# Patient Record
Sex: Female | Born: 1965
Health system: Southern US, Community
[De-identification: ages and names within clinical notes are randomized; demographics above are authoritative.]

## PROBLEM LIST (undated history)

## (undated) DIAGNOSIS — T8859XA Other complications of anesthesia, initial encounter: Secondary | ICD-10-CM

## (undated) DIAGNOSIS — G4730 Sleep apnea, unspecified: Secondary | ICD-10-CM

## (undated) DIAGNOSIS — K219 Gastro-esophageal reflux disease without esophagitis: Secondary | ICD-10-CM

## (undated) DIAGNOSIS — C801 Malignant (primary) neoplasm, unspecified: Secondary | ICD-10-CM

## (undated) DIAGNOSIS — E669 Obesity, unspecified: Secondary | ICD-10-CM

## (undated) DIAGNOSIS — E785 Hyperlipidemia, unspecified: Secondary | ICD-10-CM

## (undated) DIAGNOSIS — I10 Essential (primary) hypertension: Secondary | ICD-10-CM

## (undated) DIAGNOSIS — F419 Anxiety disorder, unspecified: Secondary | ICD-10-CM

## (undated) DIAGNOSIS — Z87442 Personal history of urinary calculi: Secondary | ICD-10-CM

## (undated) DIAGNOSIS — R519 Headache, unspecified: Secondary | ICD-10-CM

## (undated) DIAGNOSIS — G4700 Insomnia, unspecified: Secondary | ICD-10-CM

## (undated) DIAGNOSIS — E559 Vitamin D deficiency, unspecified: Secondary | ICD-10-CM

## (undated) DIAGNOSIS — N200 Calculus of kidney: Secondary | ICD-10-CM

## (undated) DIAGNOSIS — N361 Urethral diverticulum: Secondary | ICD-10-CM

## (undated) DIAGNOSIS — G5600 Carpal tunnel syndrome, unspecified upper limb: Secondary | ICD-10-CM

## (undated) DIAGNOSIS — M1990 Unspecified osteoarthritis, unspecified site: Secondary | ICD-10-CM

## (undated) DIAGNOSIS — M549 Dorsalgia, unspecified: Secondary | ICD-10-CM

## (undated) DIAGNOSIS — Z8489 Family history of other specified conditions: Secondary | ICD-10-CM

## (undated) DIAGNOSIS — L089 Local infection of the skin and subcutaneous tissue, unspecified: Secondary | ICD-10-CM

## (undated) DIAGNOSIS — T4145XA Adverse effect of unspecified anesthetic, initial encounter: Secondary | ICD-10-CM

## (undated) HISTORY — DX: Insomnia, unspecified: G47.00

## (undated) HISTORY — DX: Vitamin D deficiency, unspecified: E55.9

## (undated) HISTORY — PX: CERVICAL SPINE SURGERY: SHX589

## (undated) HISTORY — DX: Hyperlipidemia, unspecified: E78.5

## (undated) HISTORY — DX: Sleep apnea, unspecified: G47.30

## (undated) HISTORY — DX: Malignant (primary) neoplasm, unspecified: C80.1

## (undated) HISTORY — DX: Essential (primary) hypertension: I10

## (undated) HISTORY — DX: Carpal tunnel syndrome, unspecified upper limb: G56.00

## (undated) HISTORY — DX: Gastro-esophageal reflux disease without esophagitis: K21.9

---
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
## 1992-06-24 HISTORY — PX: VAGINAL HYSTERECTOMY: SUR661

## 2007-08-29 ENCOUNTER — Emergency Department: Payer: Self-pay | Admitting: Emergency Medicine

## 2007-09-18 DIAGNOSIS — Z720 Tobacco use: Secondary | ICD-10-CM | POA: Insufficient documentation

## 2007-09-18 DIAGNOSIS — J309 Allergic rhinitis, unspecified: Secondary | ICD-10-CM | POA: Insufficient documentation

## 2007-09-18 DIAGNOSIS — G5600 Carpal tunnel syndrome, unspecified upper limb: Secondary | ICD-10-CM | POA: Insufficient documentation

## 2007-09-18 DIAGNOSIS — G43909 Migraine, unspecified, not intractable, without status migrainosus: Secondary | ICD-10-CM | POA: Insufficient documentation

## 2007-09-18 DIAGNOSIS — G4700 Insomnia, unspecified: Secondary | ICD-10-CM | POA: Insufficient documentation

## 2007-09-18 DIAGNOSIS — E7800 Pure hypercholesterolemia, unspecified: Secondary | ICD-10-CM | POA: Insufficient documentation

## 2008-04-12 ENCOUNTER — Ambulatory Visit: Payer: Self-pay | Admitting: Family Medicine

## 2008-06-24 HISTORY — PX: OTHER SURGICAL HISTORY: SHX169

## 2008-10-18 ENCOUNTER — Ambulatory Visit: Payer: Self-pay | Admitting: Family Medicine

## 2009-03-20 ENCOUNTER — Ambulatory Visit: Payer: Self-pay | Admitting: Family Medicine

## 2009-07-03 DIAGNOSIS — S2239XA Fracture of one rib, unspecified side, initial encounter for closed fracture: Secondary | ICD-10-CM | POA: Insufficient documentation

## 2009-07-12 DIAGNOSIS — M79609 Pain in unspecified limb: Secondary | ICD-10-CM | POA: Insufficient documentation

## 2009-08-15 DIAGNOSIS — M654 Radial styloid tenosynovitis [de Quervain]: Secondary | ICD-10-CM | POA: Insufficient documentation

## 2009-08-18 DIAGNOSIS — R030 Elevated blood-pressure reading, without diagnosis of hypertension: Secondary | ICD-10-CM | POA: Insufficient documentation

## 2010-04-16 ENCOUNTER — Ambulatory Visit: Payer: Self-pay | Admitting: Urology

## 2011-01-17 ENCOUNTER — Emergency Department: Payer: Self-pay | Admitting: Emergency Medicine

## 2012-12-01 ENCOUNTER — Ambulatory Visit: Payer: Self-pay | Admitting: Family Medicine

## 2012-12-14 ENCOUNTER — Ambulatory Visit: Payer: Self-pay | Admitting: Family Medicine

## 2013-02-02 ENCOUNTER — Ambulatory Visit: Payer: Self-pay | Admitting: Family Medicine

## 2013-02-24 ENCOUNTER — Ambulatory Visit: Payer: Self-pay | Admitting: Family Medicine

## 2013-12-10 LAB — HM PAP SMEAR: HM Pap smear: NEGATIVE

## 2013-12-17 ENCOUNTER — Ambulatory Visit: Payer: Self-pay | Admitting: Family Medicine

## 2014-01-16 LAB — HM MAMMOGRAPHY

## 2014-03-10 ENCOUNTER — Ambulatory Visit: Payer: Self-pay | Admitting: Family Medicine

## 2014-03-11 LAB — HEPATIC FUNCTION PANEL
ALT: 20 U/L (ref 7–35)
AST: 13 U/L (ref 13–35)

## 2014-03-11 LAB — CBC AND DIFFERENTIAL
HCT: 47 % — AB (ref 36–46)
Hemoglobin: 16.1 g/dL — AB (ref 12.0–16.0)
Platelets: 270 10*3/uL (ref 150–399)
WBC: 5.8 10^3/mL

## 2014-03-11 LAB — BASIC METABOLIC PANEL
BUN: 18 mg/dL (ref 4–21)
Creatinine: 1 mg/dL (ref 0.5–1.1)
Glucose: 100 mg/dL
Potassium: 4.5 mmol/L (ref 3.4–5.3)
Sodium: 141 mmol/L (ref 137–147)

## 2014-03-29 ENCOUNTER — Ambulatory Visit: Payer: Self-pay

## 2014-04-07 ENCOUNTER — Emergency Department: Payer: Self-pay | Admitting: Emergency Medicine

## 2014-06-13 ENCOUNTER — Ambulatory Visit: Payer: Self-pay | Admitting: Gastroenterology

## 2014-06-13 LAB — HM COLONOSCOPY: HM Colonoscopy: NORMAL

## 2014-06-24 HISTORY — PX: BREAST BIOPSY: SHX20

## 2014-08-01 ENCOUNTER — Ambulatory Visit: Payer: Self-pay | Admitting: Family Medicine

## 2014-12-19 ENCOUNTER — Encounter: Payer: Self-pay | Admitting: Family Medicine

## 2014-12-19 ENCOUNTER — Ambulatory Visit (INDEPENDENT_AMBULATORY_CARE_PROVIDER_SITE_OTHER): Payer: BLUE CROSS/BLUE SHIELD | Admitting: Family Medicine

## 2014-12-19 VITALS — BP 112/72 | HR 72 | Temp 97.8°F | Resp 16 | Ht 65.5 in | Wt 189.2 lb

## 2014-12-19 DIAGNOSIS — M5416 Radiculopathy, lumbar region: Secondary | ICD-10-CM

## 2014-12-19 DIAGNOSIS — M5410 Radiculopathy, site unspecified: Secondary | ICD-10-CM | POA: Diagnosis not present

## 2014-12-19 MED ORDER — HYDROCODONE-ACETAMINOPHEN 5-325 MG PO TABS: ORAL_TABLET | ORAL | Status: DC

## 2014-12-19 MED ORDER — PREDNISONE 20 MG PO TABS: ORAL_TABLET | ORAL | Status: DC

## 2014-12-19 MED ORDER — CYCLOBENZAPRINE HCL 5 MG PO TABS: 5.0000 mg | ORAL_TABLET | Freq: Every day | ORAL | Status: DC

## 2014-12-19 NOTE — Patient Instructions (Signed)
Stop ibuprofen while on prednisone.
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
## 2014-12-20 ENCOUNTER — Ambulatory Visit
Admission: RE | Admit: 2014-12-20 | Discharge: 2014-12-20 | Disposition: A | Discharge status: Home / Self Care | Payer: BLUE CROSS/BLUE SHIELD | Source: Ambulatory Visit | Attending: Family Medicine | Admitting: Family Medicine

## 2014-12-20 ENCOUNTER — Telehealth: Payer: Self-pay

## 2014-12-20 ENCOUNTER — Telehealth: Payer: Self-pay | Admitting: Family Medicine

## 2014-12-20 DIAGNOSIS — M5416 Radiculopathy, lumbar region: Secondary | ICD-10-CM

## 2014-12-20 DIAGNOSIS — M545 Low back pain: Secondary | ICD-10-CM | POA: Diagnosis present

## 2014-12-20 DIAGNOSIS — M5410 Radiculopathy, site unspecified: Secondary | ICD-10-CM | POA: Insufficient documentation

## 2014-12-20 NOTE — Telephone Encounter (Signed)
-----   Message from [name], [place] sent at 12/20/2014 10:26 AM EDT ----- Corliss Blacker of mild arthritic changes. If you do not improve with current treatment we will send you to orthopedics.

## 2014-12-20 NOTE — Telephone Encounter (Signed)
Patient has been advised,

## 2014-12-20 NOTE — Telephone Encounter (Signed)
Stacy with [facility] called to verify the dosage for Rx HYDROcodone-acetaminophen (NORCO/VICODIN) 5-325 MG.  RW#[phone]/YP

## 2014-12-30 DIAGNOSIS — N63 Unspecified lump in unspecified breast: Secondary | ICD-10-CM | POA: Insufficient documentation

## 2014-12-30 DIAGNOSIS — J159 Unspecified bacterial pneumonia: Secondary | ICD-10-CM | POA: Insufficient documentation

## 2014-12-30 DIAGNOSIS — Z8541 Personal history of malignant neoplasm of cervix uteri: Secondary | ICD-10-CM | POA: Insufficient documentation

## 2014-12-30 DIAGNOSIS — H8110 Benign paroxysmal vertigo, unspecified ear: Secondary | ICD-10-CM | POA: Insufficient documentation

## 2014-12-30 DIAGNOSIS — N200 Calculus of kidney: Secondary | ICD-10-CM | POA: Insufficient documentation

## 2014-12-30 DIAGNOSIS — Z713 Dietary counseling and surveillance: Secondary | ICD-10-CM | POA: Insufficient documentation

## 2014-12-30 DIAGNOSIS — R109 Unspecified abdominal pain: Secondary | ICD-10-CM | POA: Insufficient documentation

## 2014-12-30 DIAGNOSIS — K219 Gastro-esophageal reflux disease without esophagitis: Secondary | ICD-10-CM | POA: Insufficient documentation

## 2014-12-30 DIAGNOSIS — G4733 Obstructive sleep apnea (adult) (pediatric): Secondary | ICD-10-CM | POA: Insufficient documentation

## 2014-12-30 DIAGNOSIS — E559 Vitamin D deficiency, unspecified: Secondary | ICD-10-CM | POA: Insufficient documentation

## 2014-12-30 DIAGNOSIS — M549 Dorsalgia, unspecified: Secondary | ICD-10-CM | POA: Insufficient documentation

## 2014-12-30 DIAGNOSIS — R0683 Snoring: Secondary | ICD-10-CM | POA: Insufficient documentation

## 2014-12-30 DIAGNOSIS — Z1389 Encounter for screening for other disorder: Secondary | ICD-10-CM | POA: Insufficient documentation

## 2015-01-02 ENCOUNTER — Encounter: Payer: BLUE CROSS/BLUE SHIELD | Admitting: Family Medicine

## 2015-04-20 ENCOUNTER — Ambulatory Visit (INDEPENDENT_AMBULATORY_CARE_PROVIDER_SITE_OTHER): Payer: BLUE CROSS/BLUE SHIELD

## 2015-04-20 DIAGNOSIS — Z23 Encounter for immunization: Secondary | ICD-10-CM

## 2015-05-12 ENCOUNTER — Ambulatory Visit (INDEPENDENT_AMBULATORY_CARE_PROVIDER_SITE_OTHER): Payer: BLUE CROSS/BLUE SHIELD | Admitting: Physician Assistant

## 2015-05-12 ENCOUNTER — Encounter: Payer: Self-pay | Admitting: Physician Assistant

## 2015-05-12 VITALS — BP 120/70 | HR 81 | Temp 97.8°F | Resp 16 | Ht 65.0 in | Wt 190.0 lb

## 2015-05-12 DIAGNOSIS — Z23 Encounter for immunization: Secondary | ICD-10-CM

## 2015-05-12 DIAGNOSIS — E7800 Pure hypercholesterolemia, unspecified: Secondary | ICD-10-CM | POA: Diagnosis not present

## 2015-05-12 DIAGNOSIS — N63 Unspecified lump in unspecified breast: Secondary | ICD-10-CM

## 2015-05-12 DIAGNOSIS — K219 Gastro-esophageal reflux disease without esophagitis: Secondary | ICD-10-CM | POA: Diagnosis not present

## 2015-05-12 DIAGNOSIS — Z1239 Encounter for other screening for malignant neoplasm of breast: Secondary | ICD-10-CM

## 2015-05-12 DIAGNOSIS — R030 Elevated blood-pressure reading, without diagnosis of hypertension: Secondary | ICD-10-CM

## 2015-05-12 DIAGNOSIS — M47816 Spondylosis without myelopathy or radiculopathy, lumbar region: Secondary | ICD-10-CM

## 2015-05-12 DIAGNOSIS — Z0000 Encounter for general adult medical examination without abnormal findings: Secondary | ICD-10-CM | POA: Diagnosis not present

## 2015-05-12 MED ORDER — PANTOPRAZOLE SODIUM 40 MG PO TBEC: 40.0000 mg | DELAYED_RELEASE_TABLET | Freq: Every day | ORAL | Status: DC

## 2015-05-12 NOTE — Patient Instructions (Signed)
Health Maintenance, Female Adopting a healthy lifestyle and getting preventive care can go a long way to promote health and wellness. Talk with your health care provider about what schedule of regular examinations is right for you. This is a good chance for you to check in with your provider about disease prevention and staying healthy. In between checkups, there are plenty of things you can do on your own. Experts have done a lot of research about which lifestyle changes and preventive measures are most likely to keep you healthy. Ask your health care provider for more information. WEIGHT AND DIET  Eat a healthy diet 1. Be sure to include plenty of vegetables, fruits, low-fat dairy products, and lean protein. 2. Do not eat a lot of foods high in solid fats, added sugars, or salt. 3. Get regular exercise. This is one of the most important things you can do for your health. 1. Most adults should exercise for at least 150 minutes each week. The exercise should increase your heart rate and make you sweat (moderate-intensity exercise). 2. Most adults should also do strengthening exercises at least twice a week. This is in addition to the moderate-intensity exercise.  Maintain a healthy weight 1. Body mass index (BMI) is a measurement that can be used to identify possible weight problems. It estimates body fat based on height and weight. Your health care provider can help determine your BMI and help you achieve or maintain a healthy weight. 2. For females 87 years of age and older:  1. A BMI below 18.5 is considered underweight. 2. A BMI of 18.5 to 24.9 is normal. 3. A BMI of 25 to 29.9 is considered overweight. 4. A BMI of 30 and above is considered obese.  Watch levels of cholesterol and blood lipids 1. You should start having your blood tested for lipids and cholesterol at 51 years of age, then have this test every 5 years. 2. You may need to have your cholesterol levels checked more often  if: 1. Your lipid or cholesterol levels are high. 2. You are older than 51 years of age. 3. You are at high risk for heart disease.  CANCER SCREENING   Lung Cancer 1. Lung cancer screening is recommended for adults 87-65 years old who are at high risk for lung cancer because of a history of smoking. 2. A yearly low-dose CT scan of the lungs is recommended for people who: 1. Currently smoke. 2. Have quit within the past 15 years. 3. Have at least a 30-pack-year history of smoking. A pack year is smoking an average of one pack of cigarettes a day for 1 year. 3. Yearly screening should continue until it has been 15 years since you quit. 4. Yearly screening should stop if you develop a health problem that would prevent you from having lung cancer treatment.  Breast Cancer  Practice breast self-awareness. This means understanding how your breasts normally appear and feel.  It also means doing regular breast self-exams. Let your health care provider know about any changes, no matter how small.  If you are in your 20s or 30s, you should have a clinical breast exam (CBE) by a health care provider every 1-3 years as part of a regular health exam.  If you are 72 or older, have a CBE every year. Also consider having a breast X-ray (mammogram) every year.  If you have a family history of breast cancer, talk to your health care provider about genetic screening.  If you  are at high risk for breast cancer, talk to your health care provider about having an MRI and a mammogram every year.  Breast cancer gene (BRCA) assessment is recommended for women who have family members with BRCA-related cancers. BRCA-related cancers include:  Breast.  Ovarian.  Tubal.  Peritoneal cancers.  Results of the assessment will determine the need for genetic counseling and BRCA1 and BRCA2 testing. Cervical Cancer Your health care provider may recommend that you be screened regularly for cancer of the pelvic  organs (ovaries, uterus, and vagina). This screening involves a pelvic examination, including checking for microscopic changes to the surface of your cervix (Pap test). You may be encouraged to have this screening done every 3 years, beginning at age 23.  For women ages 38-65, health care providers may recommend pelvic exams and Pap testing every 3 years, or they may recommend the Pap and pelvic exam, combined with testing for human papilloma virus (HPV), every 5 years. Some types of HPV increase your risk of cervical cancer. Testing for HPV may also be done on women of any age with unclear Pap test results.  Other health care providers may not recommend any screening for nonpregnant women who are considered low risk for pelvic cancer and who do not have symptoms. Ask your health care provider if a screening pelvic exam is right for you.  If you have had past treatment for cervical cancer or a condition that could lead to cancer, you need Pap tests and screening for cancer for at least 20 years after your treatment. If Pap tests have been discontinued, your risk factors (such as having a new sexual partner) need to be reassessed to determine if screening should resume. Some women have medical problems that increase the chance of getting cervical cancer. In these cases, your health care provider may recommend more frequent screening and Pap tests. Colorectal Cancer  This type of cancer can be detected and often prevented.  Routine colorectal cancer screening usually begins at 51 years of age and continues through 51 years of age.  Your health care provider may recommend screening at an earlier age if you have risk factors for colon cancer.  Your health care provider may also recommend using home test kits to check for hidden blood in the stool.  A small camera at the end of a tube can be used to examine your colon directly (sigmoidoscopy or colonoscopy). This is done to check for the earliest forms  of colorectal cancer.  Routine screening usually begins at age 17.  Direct examination of the colon should be repeated every 5-10 years through 51 years of age. However, you may need to be screened more often if early forms of precancerous polyps or small growths are found. Skin Cancer  Check your skin from head to toe regularly.  Tell your health care provider about any new moles or changes in moles, especially if there is a change in a mole's shape or color.  Also tell your health care provider if you have a mole that is larger than the size of a pencil eraser.  Always use sunscreen. Apply sunscreen liberally and repeatedly throughout the day.  Protect yourself by wearing long sleeves, pants, a wide-brimmed hat, and sunglasses whenever you are outside. HEART DISEASE, DIABETES, AND HIGH BLOOD PRESSURE   High blood pressure causes heart disease and increases the risk of stroke. High blood pressure is more likely to develop in:  People who have blood pressure in the high end  of the normal range (130-139/85-89 mm Hg).  People who are overweight or obese.  People who are African American.  If you are 85-42 years of age, have your blood pressure checked every 3-5 years. If you are 20 years of age or older, have your blood pressure checked every year. You should have your blood pressure measured twice--once when you are at a hospital or clinic, and once when you are not at a hospital or clinic. Record the average of the two measurements. To check your blood pressure when you are not at a hospital or clinic, you can use:  An automated blood pressure machine at a pharmacy.  A home blood pressure monitor.  If you are between 19 years and 53 years old, ask your health care provider if you should take aspirin to prevent strokes.  Have regular diabetes screenings. This involves taking a blood sample to check your fasting blood sugar level.  If you are at a normal weight and have a low risk  for diabetes, have this test once every three years after 51 years of age.  If you are overweight and have a high risk for diabetes, consider being tested at a younger age or more often. PREVENTING INFECTION  Hepatitis B  If you have a higher risk for hepatitis B, you should be screened for this virus. You are considered at high risk for hepatitis B if:  You were born in a country where hepatitis B is common. Ask your health care provider which countries are considered high risk.  Your parents were born in a high-risk country, and you have not been immunized against hepatitis B (hepatitis B vaccine).  You have HIV or AIDS.  You use needles to inject street drugs.  You live with someone who has hepatitis B.  You have had sex with someone who has hepatitis B.  You get hemodialysis treatment.  You take certain medicines for conditions, including cancer, organ transplantation, and autoimmune conditions. Hepatitis C  Blood testing is recommended for:  Everyone born from [birth year] through 1967.  Anyone with known risk factors for hepatitis C. Sexually transmitted infections (STIs)  You should be screened for sexually transmitted infections (STIs) including gonorrhea and chlamydia if:  You are sexually active and are younger than 51 years of age.  You are older than 51 years of age and your health care provider tells you that you are at risk for this type of infection.  Your sexual activity has changed since you were last screened and you are at an increased risk for chlamydia or gonorrhea. Ask your health care provider if you are at risk.  If you do not have HIV, but are at risk, it may be recommended that you take a prescription medicine daily to prevent HIV infection. This is called pre-exposure prophylaxis (PrEP). You are considered at risk if:  You are sexually active and do not regularly use condoms or know the HIV status of your partner(s).  You take drugs by injection.  You  are sexually active with a partner who has HIV. Talk with your health care provider about whether you are at high risk of being infected with HIV. If you choose to begin PrEP, you should first be tested for HIV. You should then be tested every 3 months for as long as you are taking PrEP.  PREGNANCY   If you are premenopausal and you may become pregnant, ask your health care provider about preconception counseling.  If you may  become pregnant, take 400 to 800 micrograms (mcg) of folic acid every day.  If you want to prevent pregnancy, talk to your health care provider about birth control (contraception). OSTEOPOROSIS AND MENOPAUSE   Osteoporosis is a disease in which the bones lose minerals and strength with aging. This can result in serious bone fractures. Your risk for osteoporosis can be identified using a bone density scan.  If you are 29 years of age or older, or if you are at risk for osteoporosis and fractures, ask your health care provider if you should be screened.  Ask your health care provider whether you should take a calcium or vitamin D supplement to lower your risk for osteoporosis.  Menopause may have certain physical symptoms and risks.  Hormone replacement therapy may reduce some of these symptoms and risks. Talk to your health care provider about whether hormone replacement therapy is right for you.  HOME CARE INSTRUCTIONS   Schedule regular health, dental, and eye exams.  Stay current with your immunizations.   Do not use any tobacco products including cigarettes, chewing tobacco, or electronic cigarettes.  If you are pregnant, do not drink alcohol.  If you are breastfeeding, limit how much and how often you drink alcohol.  Limit alcohol intake to no more than 1 drink per day for nonpregnant women. One drink equals 12 ounces of beer, 5 ounces of wine, or 1 ounces of hard liquor.  Do not use street drugs.  Do not share needles.  Ask your health care provider  for help if you need support or information about quitting drugs.  Tell your health care provider if you often feel depressed.  Tell your health care provider if you have ever been abused or do not feel safe at home.   This information is not intended to replace advice given to you by your health care provider. Make sure you discuss any questions you have with your health care provider.   Document Released: 12/24/2010 Document Revised: 07/01/2014 Document Reviewed: 05/12/2013 Elsevier Interactive Patient Education 2016 [place] [facility]) Exercise Recommendation  Being physically active is important to prevent heart disease and stroke, the nation's No. 1and No. 5killers. To improve overall cardiovascular health, we suggest at least 150 minutes per week of moderate exercise or 75 minutes per week of vigorous exercise (or a combination of moderate and vigorous activity). Thirty minutes a day, five times a week is an easy goal to remember. You will also experience benefits even if you divide your time into two or three segments of 10 to 15 minutes per day.  For people who would benefit from lowering their blood pressure or cholesterol, we recommend 40 minutes of aerobic exercise of moderate to vigorous intensity three to four times a week to lower the risk for heart attack and stroke.  Physical activity is anything that makes you move your body and burn calories.  This includes things like climbing stairs or playing sports. Aerobic exercises benefit your heart, and include walking, jogging, swimming or biking. Strength and stretching exercises are best for overall stamina and flexibility.  The simplest, positive change you can make to effectively improve your heart health is to start walking. It's enjoyable, free, easy, social and great exercise. A walking program is flexible and boasts high success rates because people can stick with it. It's easy for walking to become  a regular and satisfying part of life.   For Overall Cardiovascular Health:  At least 30  minutes of moderate-intensity aerobic activity at least 5 days per week for a total of 150  OR   At least 25 minutes of vigorous aerobic activity at least 3 days per week for a total of 75 minutes; or a combination of moderate- and vigorous-intensity aerobic activity  AND   Moderate- to high-intensity muscle-strengthening activity at least 2 days per week for additional health benefits.  For Lowering Blood Pressure and Cholesterol  An average 40 minutes of moderate- to vigorous-intensity aerobic activity 3 or 4 times per week  What if I can't make it to the time goal? Something is always better than nothing! And everyone has to start somewhere. Even if you've been sedentary for years, today is the day you can begin to make healthy changes in your life. If you don't think you'll make it for 30 or 40 minutes, set a reachable goal for today. You can work up toward your overall goal by increasing your time as you get stronger. Don't let all-or-nothing thinking rob you of doing what you can every day.  Source:http://www.heart.org

## 2015-05-12 NOTE — Progress Notes (Signed)
Patient: Dawn Summers, Female    DOB: 07-Mar-1966, 49 y.o.   MRN: [id number] Visit Date: 05/12/2015  Today's Provider: Mar Daring, PA-C   Chief Complaint  Patient presents with  . Annual Exam   Subjective:    Annual physical exam Dawn Summers is a 49 y.o. female who presents today for health maintenance and complete physical. She feels well. She reports not exercising. She reports she is sleeping poorly. Per patient has tried many medicines to help her go to sleep.  Had pneumonia twice last year.   PCP:12/10/13 Pap: 2015;NL Mammogram: 12/17/13 Negative repeat in 1 year. Colonoscopy: 06/13/14; Normal- repeat in 10 years.     Review of Systems  Constitutional: Positive for diaphoresis and fatigue.  HENT: Positive for congestion, sinus pressure and sneezing.   Eyes: Positive for pain.  Gastrointestinal: Positive for constipation and abdominal distention.  Genitourinary: Positive for urgency.  Musculoskeletal: Positive for back pain, joint swelling, arthralgias and neck pain.  Neurological: Positive for numbness.  Psychiatric/Behavioral: Positive for sleep disturbance.  All other systems reviewed and are negative.   Social History She  reports that she quit smoking about 10 years ago. She has never used smokeless tobacco. She reports that she drinks alcohol. She reports that she does not use illicit drugs. Social History   Social History  . Marital Status: Single    Spouse Name: N/A  . Number of Children: N/A  . Years of Education: N/A   Social History Main Topics  . Smoking status: Former Smoker    Quit date: 11/22/2004  . Smokeless tobacco: Never Used  . Alcohol Use: 0.0 oz/week    0 Standard drinks or equivalent per week  . Drug Use: No  . Sexual Activity: Not Asked   Other Topics Concern  . None   Social History Narrative    Patient Active Problem List   Diagnosis Date Noted  . Back ache 12/30/2014  . Benign paroxysmal positional  nystagmus 12/30/2014  . Breast lump 12/30/2014  . Malignant neoplasm of cervix uteri ([place]) 12/30/2014  . Screening for gout 12/30/2014  . Esophageal reflux 12/30/2014  . Calculus of kidney 12/30/2014  . Bacterial pneumonia 12/30/2014  . Right flank pain 12/30/2014  . Obstructive apnea 12/30/2014  . Snores 12/30/2014  . Avitaminosis D 12/30/2014  . Blood pressure elevated without history of HTN 08/18/2009  . Radial styloid tenosynovitis 08/15/2009  . Extremity pain 07/12/2009  . Closed fracture of one rib 07/03/2009  . Allergic rhinitis 09/18/2007  . Carpal tunnel syndrome 09/18/2007  . Cannot sleep 09/18/2007  . Headache, migraine 09/18/2007  . Hypercholesterolemia without hypertriglyceridemia 09/18/2007  . Tobacco use 09/18/2007    Past Surgical History  Procedure Laterality Date  . Vaginal hysterectomy  1994    cervical cancer, ovaries intact  . Cervical spine surgery      C-3 and C-4 fusion  . Bladder "pack"  2010    Family History  Family Status  Relation Status Death Age  . Mother Deceased   . Father Alive    Her family history includes COPD in her father; Cancer in her father; Colon polyps in her father; Congestive Heart Failure in her mother; Coronary artery disease in her mother; Diabetes in her mother; Hyperlipidemia in her father; Hypertension in her father; Lung disease in her mother; Stroke in her father.    Allergies  Allergen Reactions  . Penicillins Hives  . Sulfa Antibiotics Nausea And Vomiting  .  Trazodone And Nefazodone Nausea Only and Other (See Comments)    dizziness    Previous Medications   OMEPRAZOLE (PRILOSEC) 20 MG CAPSULE    Take 20 mg by mouth daily.    Patient Care Team: Margarita Rana, MD as PCP - General (Family Medicine)     Objective:   Vitals: BP 120/70 mmHg  Pulse 81  Temp(Src) 97.8 F (36.6 C) (Oral)  Resp 16  Ht 5\' 5"  (1.651 m)  Wt 190 lb (86.183 kg)  BMI 31.62 kg/m2   Physical Exam  Constitutional: She is oriented  to person, place, and time. She appears well-developed and well-nourished. No distress.  HENT:  Head: Normocephalic and atraumatic.  Right Ear: External ear normal.  Left Ear: External ear normal.  Nose: Nose normal.  Mouth/Throat: Oropharynx is clear and moist. No oropharyngeal exudate.  Eyes: Conjunctivae and EOM are normal. Pupils are equal, round, and reactive to light. Right eye exhibits no discharge. Left eye exhibits no discharge. No scleral icterus.  Neck: Normal range of motion. Neck supple. No JVD present. No tracheal deviation present. No thyromegaly present.  Cardiovascular: Normal rate, regular rhythm, normal heart sounds and intact distal pulses.  Exam reveals no gallop and no friction rub.   No murmur heard. Pulmonary/Chest: Effort normal and breath sounds normal. No respiratory distress. She has no wheezes. She has no rales. She exhibits no tenderness. Right breast exhibits no inverted nipple, no mass, no nipple discharge, no skin change and no tenderness. Left breast exhibits mass and tenderness. Left breast exhibits no inverted nipple, no nipple discharge and no skin change. Breasts are symmetrical.    Abdominal: Soft. Bowel sounds are normal. She exhibits no distension and no mass. There is no tenderness. There is no rebound and no guarding.  Musculoskeletal: Normal range of motion. She exhibits no edema or tenderness.  Lymphadenopathy:    She has no cervical adenopathy.  Neurological: She is alert and oriented to person, place, and time.  Skin: Skin is warm and dry. No rash noted. She is not diaphoretic.  Psychiatric: She has a normal mood and affect. Her behavior is normal. Judgment and thought content normal.  Vitals reviewed.    Depression Screen No flowsheet data found.    Assessment & Plan:     Routine Health Maintenance and Physical Exam  1. Annual physical exam Will check labs as below.  Will follow up pending lab results. If labs are normal will follow  up in one year for repeat physical exam. - CBC with Differential - TSH  2. Hypercholesterolemia without hypertriglyceridemia H/O this.  Will check labs and follow up pending labs. - Lipid panel  3. Blood pressure elevated without history of HTN H/O this.  BP currently stable on diet and exercise.  Will check labs and f/u pending lab results.  - Comprehensive metabolic panel  4. Breast cancer screening No family history of breast cancer.  Lump was found today in left breast.  See breast lump for description.  Will order mammogram with diagnostic and [place] for further evaluation if needed. Information for [facility] breast clinic was given to patient for her to call and schedule appointment. - MM DIGITAL SCREENING BILATERAL; Future  5. Lumbar spondylosis, unspecified spinal osteoarthritis Long standing history of low back pain.  Patient is requesting referral to orthopedics for further evaluation and treatment options.  Will try to get appointment with Dr. Sharlet Salina. - Ambulatory referral to Orthopedic Surgery  6. Gastroesophageal reflux disease without esophagitis Starting to  have sensation of food getting stuck more frequently now.  She has even had episodes of regurgitating undigested food.  Protonix was refilled as below.  Will get modified barium swallow to evaluate for esophageal stricture/dysmotility vs hiatal hernia.  Will follow up pending results. - pantoprazole (PROTONIX) 40 MG tablet; Take 1 tablet (40 mg total) by mouth daily.  Dispense: 30 tablet; Refill: 3 - SLP modified barium swallow; Future  7. Breast lump Small, moveable and slightly tender breast lump noted in left breast measuring approx. 1 cm x 0.5 cm.  It was located at 1 o'clock approx 2 cm from nipple line.  Screening, diagnostic mammograms and diagnostic US are ordered.  She was given information to call [place] breast clinic to schedule these tests.  Will follow pending results. - MM Digital Diagnostic Bilat;  Future  8. Need for pneumococcal vaccination Pneumococcal vaccine was given today without complication.  Personal history of pneumonia twice last year. - Pneumococcal Polysaccharide 23   Exercise Activities and Dietary recommendations Goals    None      Immunization History  Administered Date(s) Administered  . Influenza,inj,Quad PF,36+ Mos 04/20/2015  . Tdap 11/24/2012    Health Maintenance  Topic Date Due  . Hepatitis C Screening  [birth year]  . HIV Screening  04/25/1979  . MAMMOGRAM  01/17/2016  . INFLUENZA VACCINE  01/23/2016  . PAP SMEAR  12/10/2016  . TETANUS/TDAP  11/25/2022  . COLONOSCOPY  06/13/2024      Discussed health benefits of physical activity, and encouraged her to engage in regular exercise appropriate for her age and condition.    --------------------------------------------------------------------

## 2015-05-16 ENCOUNTER — Other Ambulatory Visit: Payer: Self-pay | Admitting: Family Medicine

## 2015-05-17 ENCOUNTER — Other Ambulatory Visit: Payer: Self-pay | Admitting: Physician Assistant

## 2015-05-17 DIAGNOSIS — N6321 Unspecified lump in the left breast, upper outer quadrant: Secondary | ICD-10-CM

## 2015-05-18 LAB — CBC WITH DIFFERENTIAL/PLATELET
Basophils Absolute: 0 10*3/uL (ref 0.0–0.2)
Basos: 1 %
EOS (ABSOLUTE): 0.2 10*3/uL (ref 0.0–0.4)
Eos: 3 %
Hematocrit: 47.8 % — ABNORMAL HIGH (ref 34.0–46.6)
Hemoglobin: 16.2 g/dL — ABNORMAL HIGH (ref 11.1–15.9)
Immature Grans (Abs): 0 10*3/uL (ref 0.0–0.1)
Immature Granulocytes: 1 %
Lymphocytes Absolute: 2 10*3/uL (ref 0.7–3.1)
Lymphs: 37 %
MCH: 29.2 pg (ref 26.6–33.0)
MCHC: 33.9 g/dL (ref 31.5–35.7)
MCV: 86 fL (ref 79–97)
Monocytes Absolute: 0.5 10*3/uL (ref 0.1–0.9)
Monocytes: 9 %
Neutrophils Absolute: 2.7 10*3/uL (ref 1.4–7.0)
Neutrophils: 49 %
Platelets: 285 10*3/uL (ref 150–379)
RBC: 5.54 x10E6/uL — ABNORMAL HIGH (ref 3.77–5.28)
RDW: 13.4 % (ref 12.3–15.4)
WBC: 5.4 10*3/uL (ref 3.4–10.8)

## 2015-05-18 LAB — COMPREHENSIVE METABOLIC PANEL
ALT: 27 IU/L (ref 0–32)
AST: 19 IU/L (ref 0–40)
Albumin/Globulin Ratio: 1.7 (ref 1.1–2.5)
Albumin: 4.4 g/dL (ref 3.5–5.5)
Alkaline Phosphatase: 87 IU/L (ref 39–117)
BUN/Creatinine Ratio: 21 (ref 9–23)
BUN: 18 mg/dL (ref 6–24)
Bilirubin Total: 0.5 mg/dL (ref 0.0–1.2)
CO2: 27 mmol/L (ref 18–29)
Calcium: 9.8 mg/dL (ref 8.7–10.2)
Chloride: 100 mmol/L (ref 97–106)
Creatinine, Ser: 0.84 mg/dL (ref 0.57–1.00)
GFR calc Af Amer: 93 mL/min/{1.73_m2} (ref >59)
GFR calc non Af Amer: 81 mL/min/{1.73_m2} (ref >59)
Globulin, Total: 2.6 g/dL (ref 1.5–4.5)
Glucose: 88 mg/dL (ref 65–99)
Potassium: 4.7 mmol/L (ref 3.5–5.2)
Sodium: 143 mmol/L (ref 136–144)
Total Protein: 7 g/dL (ref 6.0–8.5)

## 2015-05-18 LAB — LIPID PANEL
Chol/HDL Ratio: 2.9 ratio units (ref 0.0–4.4)
Cholesterol, Total: 224 mg/dL — ABNORMAL HIGH (ref 100–199)
HDL: 77 mg/dL (ref >39)
LDL Calculated: 133 mg/dL — ABNORMAL HIGH (ref 0–99)
Triglycerides: 72 mg/dL (ref 0–149)
VLDL Cholesterol Cal: 14 mg/dL (ref 5–40)

## 2015-05-18 LAB — TSH: TSH: 1.8 u[IU]/mL (ref 0.450–4.500)

## 2015-05-19 ENCOUNTER — Telehealth: Payer: Self-pay

## 2015-05-19 NOTE — Telephone Encounter (Signed)
LMTCB  Thanks.  -Nolawi Kanady 

## 2015-05-19 NOTE — Telephone Encounter (Signed)
-----   Message from Mar Daring, [place] sent at 05/19/2015  8:06 AM EST ----- All labs are stable and WNL with exception of cholesterol is slightly elevated at 224 and LDL is 133, however, your HDL (good) cholesterol is elevated at 77 which offers cardioprotection against the higher LDL.  There is no need for cholesterol lowering medication at this time.  Just be mindful of your diet and try to limit foods with high fat content and high cholesterol.

## 2015-05-22 ENCOUNTER — Other Ambulatory Visit: Payer: Self-pay | Admitting: Physician Assistant

## 2015-05-22 DIAGNOSIS — R1310 Dysphagia, unspecified: Secondary | ICD-10-CM

## 2015-05-22 NOTE — Telephone Encounter (Signed)
Pt returning call to [place].  Please call back

## 2015-05-22 NOTE — Telephone Encounter (Signed)
Patient advised as directed below.  Thanks,  -Ming Mcmannis 

## 2015-05-25 ENCOUNTER — Other Ambulatory Visit: Payer: Self-pay | Admitting: Physician Assistant

## 2015-05-30 ENCOUNTER — Ambulatory Visit
Admission: RE | Admit: 2015-05-30 | Discharge: 2015-05-30 | Disposition: A | Discharge status: Home / Self Care | Payer: BLUE CROSS/BLUE SHIELD | Source: Ambulatory Visit | Attending: Physician Assistant | Admitting: Physician Assistant

## 2015-05-30 ENCOUNTER — Other Ambulatory Visit: Payer: Self-pay | Admitting: Physician Assistant

## 2015-05-30 DIAGNOSIS — R1310 Dysphagia, unspecified: Secondary | ICD-10-CM | POA: Diagnosis present

## 2015-05-30 DIAGNOSIS — K449 Diaphragmatic hernia without obstruction or gangrene: Secondary | ICD-10-CM | POA: Diagnosis not present

## 2015-05-31 ENCOUNTER — Telehealth: Payer: Self-pay

## 2015-05-31 DIAGNOSIS — R1310 Dysphagia, unspecified: Secondary | ICD-10-CM

## 2015-05-31 DIAGNOSIS — K449 Diaphragmatic hernia without obstruction or gangrene: Secondary | ICD-10-CM

## 2015-05-31 DIAGNOSIS — K219 Gastro-esophageal reflux disease without esophagitis: Secondary | ICD-10-CM

## 2015-05-31 NOTE — Telephone Encounter (Signed)
LMTCB  Thanks,  -Joseline 

## 2015-05-31 NOTE — Telephone Encounter (Signed)
-----   Message from Mar Daring, [place] sent at 05/31/2015  9:28 AM EST ----- Normal upper GI imaging with small hiatal hernia.  May require referral to GI for consideration of endoscopy since symptoms are not improving.  If referral is wanted please let me know and I will place order.  Thanks.

## 2015-06-01 ENCOUNTER — Telehealth: Payer: Self-pay | Admitting: Gastroenterology

## 2015-06-01 NOTE — Telephone Encounter (Signed)
I have called patient to make appointment per referral request. No answer. I have left a message on cell phone. Called patient work number and she has left for the day. Patient will need an appointment with Dr Allen Norris.

## 2015-06-01 NOTE — Telephone Encounter (Signed)
No answer  Thanks,  -Joseline 

## 2015-06-01 NOTE — Telephone Encounter (Signed)
Pt is returning call.  CB#[phone] before 3-after [phone]

## 2015-06-01 NOTE — Telephone Encounter (Signed)
Patient advised as directed below. Per patient ok to put the referral to GI. Also patient wants Tawanna Sat to know that the pantoprazole is not helping her at all.   Thanks,  -Joseline

## 2015-06-01 NOTE — Telephone Encounter (Signed)
Referral has been made.  She may increase use to twice daily.  Call if still no improvement.  Thanks.

## 2015-06-05 ENCOUNTER — Other Ambulatory Visit: Payer: BLUE CROSS/BLUE SHIELD

## 2015-06-05 ENCOUNTER — Ambulatory Visit
Admission: RE | Admit: 2015-06-05 | Discharge: 2015-06-05 | Disposition: A | Discharge status: Home / Self Care | Payer: BLUE CROSS/BLUE SHIELD | Source: Ambulatory Visit | Attending: Physician Assistant | Admitting: Physician Assistant

## 2015-06-05 ENCOUNTER — Ambulatory Visit: Payer: BLUE CROSS/BLUE SHIELD

## 2015-06-05 ENCOUNTER — Other Ambulatory Visit: Payer: Self-pay | Admitting: Physician Assistant

## 2015-06-05 DIAGNOSIS — N6321 Unspecified lump in the left breast, upper outer quadrant: Secondary | ICD-10-CM

## 2015-06-05 DIAGNOSIS — N63 Unspecified lump in unspecified breast: Secondary | ICD-10-CM

## 2015-06-05 DIAGNOSIS — N6002 Solitary cyst of left breast: Secondary | ICD-10-CM | POA: Insufficient documentation

## 2015-06-06 ENCOUNTER — Other Ambulatory Visit: Payer: Self-pay | Admitting: Physician Assistant

## 2015-06-06 DIAGNOSIS — N63 Unspecified lump in unspecified breast: Secondary | ICD-10-CM

## 2015-06-06 NOTE — Telephone Encounter (Signed)
Patient advised on how to do the pantoprazole and already have the appointment set up with GI.   Thanks,  -Dametria Tuzzolino

## 2015-06-09 ENCOUNTER — Other Ambulatory Visit: Payer: Self-pay | Admitting: Physician Assistant

## 2015-06-09 ENCOUNTER — Other Ambulatory Visit: Payer: Self-pay | Admitting: Diagnostic Radiology

## 2015-06-09 ENCOUNTER — Ambulatory Visit
Admission: RE | Admit: 2015-06-09 | Discharge: 2015-06-09 | Disposition: A | Discharge status: Home / Self Care | Payer: BLUE CROSS/BLUE SHIELD | Source: Ambulatory Visit | Attending: Physician Assistant | Admitting: Physician Assistant

## 2015-06-09 DIAGNOSIS — Q859 Phakomatosis, unspecified: Secondary | ICD-10-CM | POA: Diagnosis not present

## 2015-06-09 DIAGNOSIS — N63 Unspecified lump in unspecified breast: Secondary | ICD-10-CM

## 2015-06-09 DIAGNOSIS — Z9889 Other specified postprocedural states: Secondary | ICD-10-CM

## 2015-06-12 ENCOUNTER — Telehealth: Payer: Self-pay | Admitting: Family Medicine

## 2015-06-12 LAB — SURGICAL PATHOLOGY

## 2015-06-12 NOTE — Telephone Encounter (Signed)
Did you call her?

## 2015-06-12 NOTE — Telephone Encounter (Signed)
Discussed over the phone with patient results of breast biopsy.

## 2015-06-12 NOTE — Telephone Encounter (Signed)
Pt called saying you contacted her regarding her pathology results,  Please call her at [phone]  [facility]

## 2015-06-14 LAB — PATHOLOGY

## 2015-07-04 ENCOUNTER — Other Ambulatory Visit: Payer: Self-pay | Admitting: Physical Medicine and Rehabilitation

## 2015-07-04 DIAGNOSIS — M5416 Radiculopathy, lumbar region: Secondary | ICD-10-CM

## 2015-07-05 ENCOUNTER — Encounter: Payer: Self-pay | Admitting: Physician Assistant

## 2015-07-05 ENCOUNTER — Ambulatory Visit (INDEPENDENT_AMBULATORY_CARE_PROVIDER_SITE_OTHER): Payer: BLUE CROSS/BLUE SHIELD | Admitting: Physician Assistant

## 2015-07-05 VITALS — BP 122/70 | HR 80 | Temp 98.5°F | Resp 16 | Wt 192.8 lb

## 2015-07-05 DIAGNOSIS — J40 Bronchitis, not specified as acute or chronic: Secondary | ICD-10-CM | POA: Diagnosis not present

## 2015-07-05 DIAGNOSIS — J0100 Acute maxillary sinusitis, unspecified: Secondary | ICD-10-CM | POA: Diagnosis not present

## 2015-07-05 DIAGNOSIS — R05 Cough: Secondary | ICD-10-CM

## 2015-07-05 DIAGNOSIS — R059 Cough, unspecified: Secondary | ICD-10-CM

## 2015-07-05 MED ORDER — AZITHROMYCIN 250 MG PO TABS: ORAL_TABLET | ORAL | Status: DC

## 2015-07-05 MED ORDER — HYDROCODONE-HOMATROPINE 5-1.5 MG/5ML PO SYRP: 5.0000 mL | ORAL_SOLUTION | Freq: Three times a day (TID) | ORAL | Status: DC | PRN

## 2015-07-05 NOTE — Progress Notes (Signed)
Patient: Dawn Summers Female    DOB: 27-Apr-1966   50 y.o.   MRN: [id number] Visit Date: 07/05/2015  Today's Provider: Mar Daring, PA-C   Chief Complaint  Patient presents with  . URI   Subjective:    URI  This is a new problem. The current episode started 1 to 4 weeks ago. The problem has been gradually worsening. There has been no fever. Associated symptoms include congestion, coughing, ear pain (left ear), headaches, nausea, rhinorrhea, sinus pain and sneezing. Pertinent negatives include no abdominal pain, chest pain, sore throat, vomiting or wheezing. She has tried nothing for the symptoms.      Allergies  Allergen Reactions  . Latex     Other reaction(s): Unknown  . Penicillins Hives  . Sulfa Antibiotics Nausea And Vomiting  . Trazodone And Nefazodone Nausea Only and Other (See Comments)    dizziness   Previous Medications   PANTOPRAZOLE (PROTONIX) 40 MG TABLET    Take 1 tablet (40 mg total) by mouth daily.    Review of Systems  Constitutional: Positive for chills and fatigue. Negative for fever.  HENT: Positive for congestion, ear pain (left ear), postnasal drip, rhinorrhea, sinus pressure, sneezing and voice change. Negative for sore throat.   Respiratory: Positive for cough, chest tightness and shortness of breath. Negative for wheezing.   Cardiovascular: Negative for chest pain, palpitations and leg swelling.  Gastrointestinal: Positive for nausea. Negative for vomiting and abdominal pain.  Neurological: Positive for headaches. Negative for dizziness.    Social History  Substance Use Topics  . Smoking status: Former Smoker    Quit date: 11/22/2004  . Smokeless tobacco: Never Used  . Alcohol Use: 0.0 oz/week    0 Standard drinks or equivalent per week   Objective:   BP 122/70 mmHg  Pulse 80  Temp(Src) 98.5 F (36.9 C) (Oral)  Resp 16  Wt 192 lb 12.8 oz (87.454 kg)  SpO2 97%  Physical Exam  Constitutional: She appears well-developed  and well-nourished. No distress.  HENT:  Head: Normocephalic and atraumatic.  Right Ear: Hearing, tympanic membrane, external ear and ear canal normal.  Left Ear: Hearing, tympanic membrane, external ear and ear canal normal.  Nose: Mucosal edema and rhinorrhea present. Right sinus exhibits maxillary sinus tenderness. Right sinus exhibits no frontal sinus tenderness. Left sinus exhibits maxillary sinus tenderness. Left sinus exhibits no frontal sinus tenderness.  Mouth/Throat: Uvula is midline, oropharynx is clear and moist and mucous membranes are normal. No oropharyngeal exudate, posterior oropharyngeal edema or posterior oropharyngeal erythema.  Neck: Normal range of motion. Neck supple. No tracheal deviation present. No thyromegaly present.  Cardiovascular: Normal rate, regular rhythm and normal heart sounds.  Exam reveals no gallop and no friction rub.   No murmur heard. Pulmonary/Chest: Effort normal. No stridor. No respiratory distress. She has no decreased breath sounds. She has no wheezes. She has rhonchi in the right upper field. She has no rales.  Lymphadenopathy:    She has no cervical adenopathy.  Skin: She is not diaphoretic.  Vitals reviewed.       Assessment & Plan:     1. Bronchitis Worsening symptoms. Will treat as below. I will also give her a sample inhaler of Breo for inflammation.  She is to use this once daily until symptoms subside. She may use Mucinex DM for daytime cough and congestion. She needs to make sure to stay well hydrated and get plenty of rest.  She is  to call the office if symptoms fail to improve or worsen. - azithromycin (ZITHROMAX) 250 MG tablet; Take 2 tablets PO on day one, and one tablet PO daily thereafter until completed.  Dispense: 6 tablet; Refill: 0 - HYDROcodone-homatropine (HYCODAN) 5-1.5 MG/5ML syrup; Take 5 mLs by mouth every 8 (eight) hours as needed for cough.  Dispense: 180 mL; Refill: 0  2. Acute maxillary sinusitis, recurrence not  specified Will treat as below.  She may use Mucinex DM for congestion and tylenol for fevers. She needs to make sure to stay well hydrated and get plenty of rest.  She is to call the office if she does not improve with treatment or if symptoms worsen. - azithromycin (ZITHROMAX) 250 MG tablet; Take 2 tablets PO on day one, and one tablet PO daily thereafter until completed.  Dispense: 6 tablet; Refill: 0  3. Cough Worsening nighttime cough. Will treat with Hycodan cough syrup as below. She is to call the office if symptoms do not improve or worsen. - HYDROcodone-homatropine (HYCODAN) 5-1.5 MG/5ML syrup; Take 5 mLs by mouth every 8 (eight) hours as needed for cough.  Dispense: 180 mL; Refill: 0       Mar Daring, PA-C  [facility]

## 2015-07-10 ENCOUNTER — Encounter: Payer: Self-pay | Admitting: Gastroenterology

## 2015-07-10 ENCOUNTER — Ambulatory Visit (INDEPENDENT_AMBULATORY_CARE_PROVIDER_SITE_OTHER): Payer: BLUE CROSS/BLUE SHIELD | Admitting: Gastroenterology

## 2015-07-10 VITALS — BP 141/86 | HR 69 | Temp 98.2°F | Ht 65.0 in | Wt 190.0 lb

## 2015-07-10 DIAGNOSIS — K219 Gastro-esophageal reflux disease without esophagitis: Secondary | ICD-10-CM | POA: Diagnosis not present

## 2015-07-10 NOTE — Progress Notes (Signed)
Gastroenterology Consultation  Referring Provider:     Margarita Rana, MD Primary Care Physician:  Margarita Rana, MD Primary Gastroenterologist:  Dr. Allen Norris     Reason for Consultation:     GERD        HPI:   Dawn Summers is a 50 y.o. y/o female referred for consultation & management of heartburn by Dr. Margarita Rana, MD.  This patient comes today with a report of heartburn. The patient states she has heartburn whenever she eats or drinks. She states she gets it after drinking a glass of water in the morning. She also reports that salad will trigger her heartburn. There is no report of any dysphagia and the patient reports that she has been taking a PPI without any relief. The patient denies any black stools or bloody stools. She also has not had any hematemesis or coffee ground emesis. She does report that most for symptoms are more in the morning then at any other times a day although they can happen at any time.   Past Medical History  Diagnosis Date  . GERD (gastroesophageal reflux disease)   . Hyperlipidemia   . Vitamin D deficiency   . Insomnia   . Cancer (HCC)     cervical  . Sleep apnea   . Carpal tunnel syndrome   . Hypertension   . Breast mass     left    Past Surgical History  Procedure Laterality Date  . Vaginal hysterectomy  1994    cervical cancer, ovaries intact  . Cervical spine surgery      C-3 and C-4 fusion  . Bladder "pack"  2010    Prior to Admission medications   Medication Sig Start Date End Date Taking? Authorizing Provider  azithromycin (ZITHROMAX) 250 MG tablet Take 2 tablets PO on day one, and one tablet PO daily thereafter until completed. 07/05/15  Yes Clearnce Sorrel Burnette, PA-C  pantoprazole (PROTONIX) 40 MG tablet Take 1 tablet (40 mg total) by mouth daily. 05/12/15  Yes Clearnce Sorrel Burnette, PA-C  ranitidine (ZANTAC) 150 MG capsule Take 150 mg by mouth 2 (two) times daily.   Yes Historical Provider, MD  HYDROcodone-homatropine (HYCODAN) 5-1.5  MG/5ML syrup Take 5 mLs by mouth every 8 (eight) hours as needed for cough. Patient not taking: Reported on 07/10/2015 07/05/15   Mar Daring, PA-C    Family History  Problem Relation Age of Onset  . Diabetes Mother   . Lung disease Mother     chronic  . Congestive Heart Failure Mother   . Coronary artery disease Mother   . Stroke Father   . Cancer Father     prostate cancer  . Hyperlipidemia Father   . Hypertension Father   . Colon polyps Father   . COPD Father   . Breast cancer Neg Hx      Social History  Substance Use Topics  . Smoking status: Former Smoker    Quit date: 11/22/2004  . Smokeless tobacco: Never Used  . Alcohol Use: 0.0 oz/week    0 Standard drinks or equivalent per week    Allergies as of 07/10/2015 - Review Complete 07/10/2015  Allergen Reaction Noted  . Latex  07/05/2015  . Penicillins Hives 12/19/2014  . Sulfa antibiotics Nausea And Vomiting 12/19/2014  . Trazodone and nefazodone Nausea Only and Other (See Comments) 12/19/2014    Review of Systems:    All systems reviewed and negative except where noted in HPI.  Physical Exam:  BP 141/86 mmHg  Pulse 69  Temp(Src) 98.2 F (36.8 C) (Oral)  Ht 5\' 5"  (1.651 m)  Wt 190 lb (86.183 kg)  BMI 31.62 kg/m2 No LMP recorded. Patient has had a hysterectomy. Psych:  Alert and cooperative. Normal mood and affect. General:   Alert,  Well-developed, well-nourished, pleasant and cooperative in NAD Head:  Normocephalic and atraumatic. Eyes:  Sclera clear, no icterus.   Conjunctiva pink. Ears:  Normal auditory acuity. Nose:  No deformity, discharge, or lesions. Mouth:  No deformity or lesions,oropharynx pink & moist. Neck:  Supple; no masses or thyromegaly. Lungs:  Respirations even and unlabored.  Clear throughout to auscultation.   No wheezes, crackles, or rhonchi. No acute distress. Heart:  Regular rate and rhythm; no murmurs, clicks, rubs, or gallops. Abdomen:  Normal bowel sounds.  No bruits.   Soft, non-tender and non-distended without masses, hepatosplenomegaly or hernias noted.  No guarding or rebound tenderness.  Negative Carnett sign.   Rectal:  Deferred.  Msk:  Symmetrical without gross deformities.  Good, equal movement & strength bilaterally. Pulses:  Normal pulses noted. Extremities:  No clubbing or edema.  No cyanosis. Neurologic:  Alert and oriented x3;  grossly normal neurologically. Skin:  Intact without significant lesions or rashes.  No jaundice. Lymph Nodes:  No significant cervical adenopathy. Psych:  Alert and cooperative. Normal mood and affect.  Imaging Studies: No results found.  Assessment and Plan:   Dawn Summers is a 50 y.o. y/o female Who comes in today with continued heartburn despite a PPI. The patient states that the heartburn is kicked off by water and salads among other things. The patient has been given samples of Dexilant and has been told to take them in the evening since that she has most of her acid suppression while she's laying down. The patient has been told that if she does not have relief of symptoms from [place] she may need a 24-hour pH probe to see if she is actually having acid reflux as a cause of her symptoms. The patient has been explained the plan and agrees with it.   Note: This dictation was prepared with Dragon dictation along with smaller phrase technology. Any transcriptional errors that result from this process are unintentional.

## 2015-07-19 ENCOUNTER — Ambulatory Visit
Admission: RE | Admit: 2015-07-19 | Discharge: 2015-07-19 | Disposition: A | Discharge status: Home / Self Care | Payer: BLUE CROSS/BLUE SHIELD | Source: Ambulatory Visit | Attending: Physical Medicine and Rehabilitation | Admitting: Physical Medicine and Rehabilitation

## 2015-07-19 DIAGNOSIS — M5416 Radiculopathy, lumbar region: Secondary | ICD-10-CM | POA: Insufficient documentation

## 2015-07-24 ENCOUNTER — Telehealth: Payer: Self-pay | Admitting: Gastroenterology

## 2015-07-24 ENCOUNTER — Other Ambulatory Visit: Payer: Self-pay

## 2015-07-24 DIAGNOSIS — K2100 Gastro-esophageal reflux disease with esophagitis, without bleeding: Secondary | ICD-10-CM

## 2015-07-24 MED ORDER — DEXLANSOPRAZOLE 60 MG PO CPDR: 60.0000 mg | DELAYED_RELEASE_CAPSULE | Freq: Every day | ORAL | Status: DC

## 2015-07-24 NOTE — Telephone Encounter (Signed)
Pt notified rx was done. Sent to [facility], [place] per pt request.

## 2015-07-24 NOTE — Telephone Encounter (Signed)
Patient called and said she only has one pill of the Dexilant left and needs to get a RX. Please call before 4:00. This seems to be helping.

## 2015-07-25 ENCOUNTER — Other Ambulatory Visit: Payer: Self-pay

## 2015-07-25 DIAGNOSIS — K2100 Gastro-esophageal reflux disease with esophagitis, without bleeding: Secondary | ICD-10-CM

## 2015-07-25 MED ORDER — OMEPRAZOLE 40 MG PO CPDR: 40.0000 mg | DELAYED_RELEASE_CAPSULE | Freq: Every day | ORAL | Status: DC

## 2015-07-26 ENCOUNTER — Other Ambulatory Visit: Payer: Self-pay

## 2015-08-07 DIAGNOSIS — M5416 Radiculopathy, lumbar region: Secondary | ICD-10-CM | POA: Insufficient documentation

## 2015-08-07 DIAGNOSIS — M5116 Intervertebral disc disorders with radiculopathy, lumbar region: Secondary | ICD-10-CM | POA: Insufficient documentation

## 2015-08-07 DIAGNOSIS — M5136 Other intervertebral disc degeneration, lumbar region: Secondary | ICD-10-CM | POA: Insufficient documentation

## 2015-08-07 DIAGNOSIS — G8929 Other chronic pain: Secondary | ICD-10-CM | POA: Insufficient documentation

## 2015-11-09 ENCOUNTER — Ambulatory Visit (INDEPENDENT_AMBULATORY_CARE_PROVIDER_SITE_OTHER): Payer: BLUE CROSS/BLUE SHIELD | Admitting: Physician Assistant

## 2015-11-09 ENCOUNTER — Encounter: Payer: Self-pay | Admitting: Physician Assistant

## 2015-11-09 VITALS — BP 116/70 | HR 62 | Temp 97.9°F | Resp 16 | Wt 182.8 lb

## 2015-11-09 DIAGNOSIS — Z6830 Body mass index (BMI) 30.0-30.9, adult: Secondary | ICD-10-CM | POA: Diagnosis not present

## 2015-11-09 DIAGNOSIS — Z713 Dietary counseling and surveillance: Secondary | ICD-10-CM

## 2015-11-09 DIAGNOSIS — K2100 Gastro-esophageal reflux disease with esophagitis, without bleeding: Secondary | ICD-10-CM

## 2015-11-09 DIAGNOSIS — E669 Obesity, unspecified: Secondary | ICD-10-CM

## 2015-11-09 MED ORDER — PHENTERMINE HCL 37.5 MG PO TABS: 37.5000 mg | ORAL_TABLET | Freq: Every day | ORAL | Status: DC

## 2015-11-09 NOTE — Patient Instructions (Signed)

## 2015-11-09 NOTE — Progress Notes (Signed)
Patient: Dawn Summers Female    DOB: [birth year]   50 y.o.   MRN: [id number] Visit Date: 11/09/2015  Today's Provider: Mar Daring, PA-C   Chief Complaint  Patient presents with  . Follow-up   Subjective:    HPIPatient is here for her 6 month follow-up and to discuss weight loss. She comes today to f/u her GERD. She has been seeing Dr. Allen Norris and is well-controlled on dexilant 60mg . She does get occasional flares but states it is associated to what she eats or how much she eats.  She is interested in weight loss. She has been to a bariatric clinic in the past and lost weight successfully. She has been trying B vitamin complexes to help her energy level. She does not do a specific food diary but has been weighing her food on a food scale to get appropriate portion sizes. She does exercise on occasion but has not made it a regular routine. She exercises approximately 3 days per week for 30 minutes.   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 She will have oatmeal for breakfast, a protein bar for snack, she prepares her lunches on Sunday for the week ahead. She admits when she gets home from work is her downfall. She does not know if it is bored eating, or stress eating, but she notices she snacks more once she gets home. She does not drink soda or alcohol, only water and one coffee in the morning.      Allergies  Allergen Reactions  . Latex     Other reaction(s): Unknown  . Penicillins Hives  . Sulfa Antibiotics Nausea And Vomiting  . Trazodone And Nefazodone Nausea Only and Other (See Comments)    dizziness   Previous Medications   DEXILANT 60 MG CAPSULE        Review of Systems  Constitutional: Negative.   HENT: Negative.   Respiratory: Negative.   Cardiovascular: Negative.   Gastrointestinal: Negative.   Musculoskeletal: Negative.   Psychiatric/Behavioral: Negative.     Social History  Substance Use Topics  . Smoking  status: Former Smoker    Quit date: 11/22/2004  . Smokeless tobacco: Never Used  . Alcohol Use: 0.0 oz/week    0 Standard drinks or equivalent per week   Objective:   BP 116/70 mmHg  Pulse 62  Temp(Src) 97.9 F (36.6 C) (Oral)  Resp 16  Wt 182 lb 12.8 oz (82.918 kg)  Physical Exam  Constitutional: She appears well-developed and well-nourished. No distress.  Neck: Normal range of motion. Neck supple. No JVD present. No tracheal deviation present. No thyromegaly present.  Cardiovascular: Normal rate, regular rhythm and normal heart sounds.  Exam reveals no gallop and no friction rub.   No murmur heard. Pulmonary/Chest: Effort normal and breath sounds normal. No respiratory distress. She has no wheezes. She has no rales.  Abdominal: Soft. Bowel sounds are normal. She exhibits no distension and no mass. There is no tenderness. There is no rebound and no guarding.  Lymphadenopathy:    She has no cervical adenopathy.  Skin: She is not diaphoretic.  Vitals reviewed.       Assessment & Plan:     1. Encounter for weight loss counseling Discussed adding a food diary to help with her habits of already measuring food for appropriate portion size to make sure she is not overeating at night. She is going to,  and states she has been trying to adhere to a 1200 calorie diet. Encouraged her to find more time to exercise as well to get to 150 minutes per week. She agrees. Will add phentermine for appetite suppression. She is to call if she has any adverse effects. I will see her back in 4 weeks for weight recheck. - phentermine (ADIPEX-P) 37.5 MG tablet; Take 1 tablet (37.5 mg total) by mouth daily before breakfast.  Dispense: 30 tablet; Refill: 0  2. Obese See above medical treatment plan. - phentermine (ADIPEX-P) 37.5 MG tablet; Take 1 tablet (37.5 mg total) by mouth daily before breakfast.  Dispense: 30 tablet; Refill: 0  3. BMI 30.0-30.9,adult See above medical treatment plan. - phentermine  (ADIPEX-P) 37.5 MG tablet; Take 1 tablet (37.5 mg total) by mouth daily before breakfast.  Dispense: 30 tablet; Refill: 0  4. Gastroesophageal reflux disease with esophagitis Stable. Continue current medical treatment plan.       Mar Daring, PA-C  [facility]

## 2015-12-08 ENCOUNTER — Encounter: Payer: Self-pay | Admitting: Physician Assistant

## 2015-12-08 ENCOUNTER — Ambulatory Visit (INDEPENDENT_AMBULATORY_CARE_PROVIDER_SITE_OTHER): Payer: BLUE CROSS/BLUE SHIELD | Admitting: Physician Assistant

## 2015-12-08 ENCOUNTER — Ambulatory Visit: Payer: BLUE CROSS/BLUE SHIELD | Admitting: Physician Assistant

## 2015-12-08 VITALS — BP 120/80 | HR 69 | Temp 97.8°F | Resp 16 | Wt 172.6 lb

## 2015-12-08 DIAGNOSIS — Z713 Dietary counseling and surveillance: Secondary | ICD-10-CM

## 2015-12-08 DIAGNOSIS — E669 Obesity, unspecified: Secondary | ICD-10-CM

## 2015-12-08 DIAGNOSIS — Z6830 Body mass index (BMI) 30.0-30.9, adult: Secondary | ICD-10-CM

## 2015-12-08 DIAGNOSIS — D2330 Other benign neoplasm of skin of unspecified part of face: Secondary | ICD-10-CM

## 2015-12-08 MED ORDER — PHENTERMINE HCL 37.5 MG PO TABS: 37.5000 mg | ORAL_TABLET | Freq: Every day | ORAL | Status: DC

## 2015-12-08 NOTE — Patient Instructions (Signed)

## 2015-12-08 NOTE — Progress Notes (Signed)
Patient: Dawn Summers Female    DOB: 05/21/1966   50 y.o.   MRN: [id number] Visit Date: 12/08/2015  Today's Provider: Mar Daring, PA-C   Chief Complaint  Patient presents with  . Follow-up    weight loss counseling   Subjective:    HPI Patient is here to follow up on weight loss counseling. She was put on Phentermine 37.5 MG . She reports that she stopped the phentermine since June 5 due to oral surgery and still not taking it. She reports that overall she is eating healthy. Ex: Breakfast: Fruits or Banana, Snack in between. Lunch: She will either eat [place] breast or chicken. Snack: Some peanut butter. Dinner: Regular meal. She reports that she is standing all the time and walks chair to chair.  Wt Readings from Last 3 Encounters:  12/08/15 172 lb 9.6 oz (78.291 kg)  11/09/15 182 lb 12.8 oz (82.918 kg)  07/10/15 190 lb (86.183 kg)   She also complains today of a cyst on her left cheek. She gets dermoid cyst mostly on her scalp and has been having them removed by her dermatologist. The one on her cheek has been increasing in size, but causes her no discomfort. She is interested in having it removed as well and her dermatologist told her she needs to have this done by a Psychiatric nurse.     Allergies  Allergen Reactions  . Latex     Other reaction(s): Unknown  . Penicillins Hives  . Sulfa Antibiotics Nausea And Vomiting  . Trazodone And Nefazodone Nausea Only and Other (See Comments)    dizziness   Current Meds  Medication Sig  . DEXILANT 60 MG capsule   . phentermine (ADIPEX-P) 37.5 MG tablet Take 1 tablet (37.5 mg total) by mouth daily before breakfast.    Review of Systems  Constitutional: Negative.   HENT: Negative.   Respiratory: Negative.   Cardiovascular: Negative for chest pain, palpitations and leg swelling.  Gastrointestinal: Negative.   Psychiatric/Behavioral: Negative.     Social History  Substance Use Topics  . Smoking status: Former  Smoker    Quit date: 11/22/2004  . Smokeless tobacco: Never Used  . Alcohol Use: 0.0 oz/week    0 Standard drinks or equivalent per week   Objective:   BP 120/80 mmHg  Pulse 69  Temp(Src) 97.8 F (36.6 C) (Oral)  Resp 16  Wt 172 lb 9.6 oz (78.291 kg)  Physical Exam  Constitutional: She appears well-developed and well-nourished. No distress.  HENT:  Head: Normocephalic and atraumatic.    Neck: Normal range of motion. Neck supple. No JVD present. No tracheal deviation present. No thyromegaly present.  Cardiovascular: Normal rate, regular rhythm and normal heart sounds.  Exam reveals no gallop and no friction rub.   No murmur heard. Pulmonary/Chest: Effort normal and breath sounds normal. No respiratory distress. She has no wheezes. She has no rales.  Lymphadenopathy:    She has no cervical adenopathy.  Skin: She is not diaphoretic.  Vitals reviewed.       Assessment & Plan:     1. Encounter for weight loss counseling Continue phentermine for weight loss. She is going to start back with a half tab and increase as tolerated. Continue dieting and exercise. Will see her back in 6 weeks for a weight recheck. - phentermine (ADIPEX-P) 37.5 MG tablet; Take 1 tablet (37.5 mg total) by mouth daily before breakfast.  Dispense: 30 tablet; Refill: 0  2. Obese See above medical treatment plan. - phentermine (ADIPEX-P) 37.5 MG tablet; Take 1 tablet (37.5 mg total) by mouth daily before breakfast.  Dispense: 30 tablet; Refill: 0  3. BMI 30.0-30.9,adult See above medical treatment plan. - phentermine (ADIPEX-P) 37.5 MG tablet; Take 1 tablet (37.5 mg total) by mouth daily before breakfast.  Dispense: 30 tablet; Refill: 0  4. Cyst, dermoid, face Will refer to plastic surgery for consultation of removal. Patient requested. - Ambulatory referral to [place], PA-C  [facility]

## 2016-01-19 ENCOUNTER — Encounter: Payer: Self-pay | Admitting: Physician Assistant

## 2016-01-19 ENCOUNTER — Ambulatory Visit (INDEPENDENT_AMBULATORY_CARE_PROVIDER_SITE_OTHER): Payer: BLUE CROSS/BLUE SHIELD | Admitting: Physician Assistant

## 2016-01-19 ENCOUNTER — Ambulatory Visit: Payer: BLUE CROSS/BLUE SHIELD | Admitting: Physician Assistant

## 2016-01-19 DIAGNOSIS — E669 Obesity, unspecified: Secondary | ICD-10-CM

## 2016-01-19 DIAGNOSIS — Z6827 Body mass index (BMI) 27.0-27.9, adult: Secondary | ICD-10-CM | POA: Diagnosis not present

## 2016-01-19 DIAGNOSIS — Z713 Dietary counseling and surveillance: Secondary | ICD-10-CM

## 2016-01-19 MED ORDER — PHENTERMINE HCL 37.5 MG PO TABS: 37.5000 mg | ORAL_TABLET | Freq: Every day | ORAL | 0 refills | Status: DC

## 2016-01-19 NOTE — Patient Instructions (Signed)

## 2016-01-19 NOTE — Progress Notes (Signed)
       Patient: Dawn Summers Female    DOB: 04-02-1966   50 y.o.   MRN: [id number] Visit Date: 01/19/2016  Today's Provider: Mar Daring, PA-C   Chief Complaint  Patient presents with  . Follow-up    Weight Loss counseling   Subjective:    HPI Patient is here to follow-up on weight loss counseling. She reports that she is still taking half tab. She is eating healthy. She reports that she is not exercising due to working 7 days a week. Today her weight is 166.2 lbs and last office visit her weight was 172 lbs. She was 192 pounds when we started phentermine and her weight loss goal.     Allergies  Allergen Reactions  . Latex     Other reaction(s): Unknown  . Penicillins Hives  . Sulfa Antibiotics Nausea And Vomiting  . Trazodone And Nefazodone Nausea Only and Other (See Comments)    dizziness   Current Meds  Medication Sig  . DEXILANT 60 MG capsule   . phentermine (ADIPEX-P) 37.5 MG tablet Take 1 tablet (37.5 mg total) by mouth daily before breakfast.    Review of Systems  Constitutional: Negative.   Respiratory: Negative.   Cardiovascular: Negative.   Gastrointestinal: Negative.   Psychiatric/Behavioral: Negative.     Social History  Substance Use Topics  . Smoking status: Former Smoker    Quit date: 11/22/2004  . Smokeless tobacco: Never Used  . Alcohol use 0.0 oz/week   Objective:   BP 112/68 (BP Location: Left Arm, Patient Position: Sitting, Cuff Size: Normal)   Pulse 83   Temp 98.2 F (36.8 C) (Oral)   Resp 16   Wt 166 lb 3.2 oz (75.4 kg)   BMI 27.66 kg/m   Physical Exam  Constitutional: She appears well-developed and well-nourished. No distress.  Cardiovascular: Normal rate, regular rhythm and normal heart sounds.  Exam reveals no gallop and no friction rub.   No murmur heard. Pulmonary/Chest: Effort normal and breath sounds normal. No respiratory distress. She has no wheezes. She has no rales.  Skin: She is not diaphoretic.  Psychiatric: She  has a normal mood and affect. Her behavior is normal. Judgment and thought content normal.  Vitals reviewed.      Assessment & Plan:     1. Encounter for weight loss counseling She continues to do very well. She is only taking a half tab daily at this time. Will continue phentermine for appetite suppression. She is to continue smaller portions, more frequent small meals and physical activity. I will see her back in 8 weeks to recheck her weight and progress.  - phentermine (ADIPEX-P) 37.5 MG tablet; Take 1 tablet (37.5 mg total) by mouth daily before breakfast.  Dispense: 30 tablet; Refill: 0  2. Obese See above medical treatment plan. - phentermine (ADIPEX-P) 37.5 MG tablet; Take 1 tablet (37.5 mg total) by mouth daily before breakfast.  Dispense: 30 tablet; Refill: 0  3. BMI 27.0-27.9,adult See above medical treatment plan. - phentermine (ADIPEX-P) 37.5 MG tablet; Take 1 tablet (37.5 mg total) by mouth daily before breakfast.  Dispense: 30 tablet; Refill: 0       Mar Daring, PA-C  [facility]

## 2016-02-25 IMAGING — MR MR LUMBAR SPINE W/O CM
4 of 5 series · 25 of 48 positions shown · non-contrast
Comparison: CT 03/29/2014

CLINICAL DATA: Low back pain, 5 years duration. Worsening recently.
Bilateral leg pain left worse than right.

EXAM:
MRI LUMBAR SPINE WITHOUT CONTRAST
TECHNIQUE: Multiplanar, multisequence MR imaging of the lumbar spine was
performed. No intravenous contrast was administered.

[Series 2: T2 · sagittal · 4.0mm · 0.81mm/px · 6 of 15 slices shown (1 of 2)]
[im 1/15]
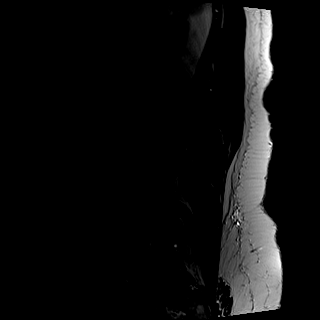
[im 3/15]
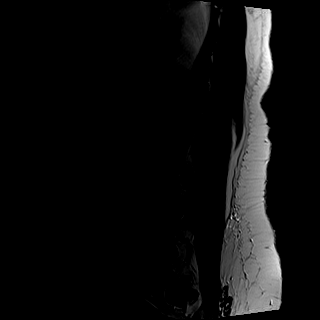
[im 6/15]
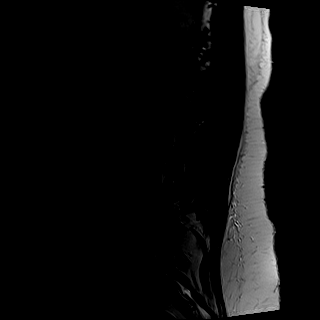
[im 9/15]
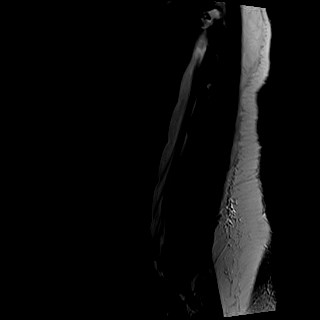
[im 12/15]
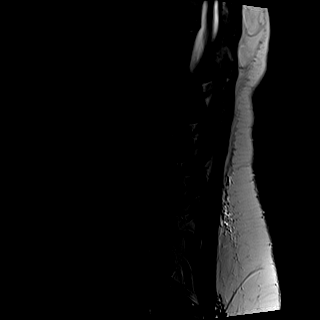
[im 15/15]
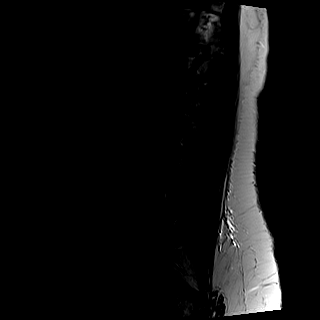

[Series 3: T1 · sagittal · 4.0mm · 0.41mm/px · 6 of 15 slices shown (1 of 2)]
[im 1/15]
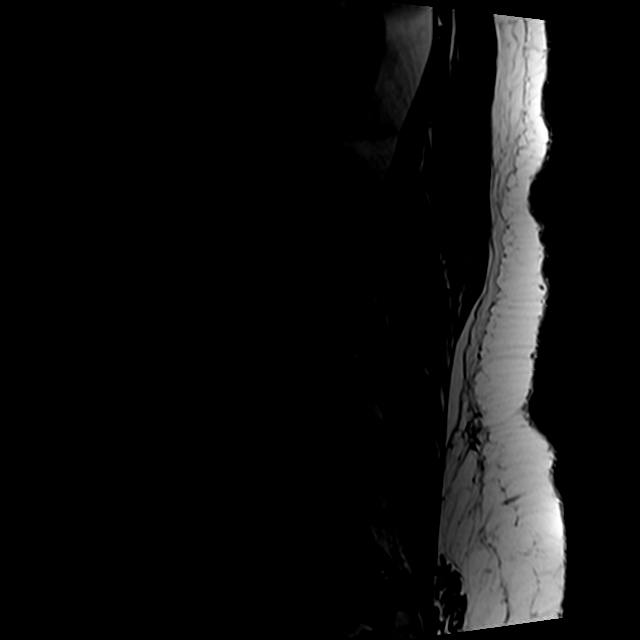
[im 3/15]
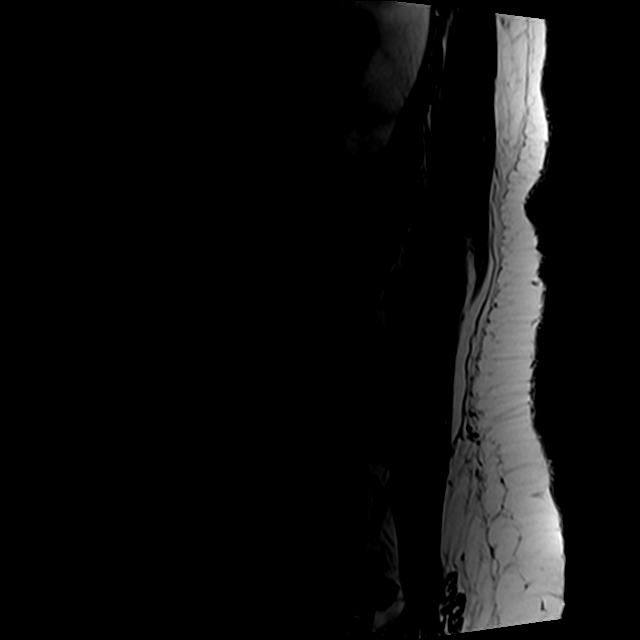
[im 6/15]
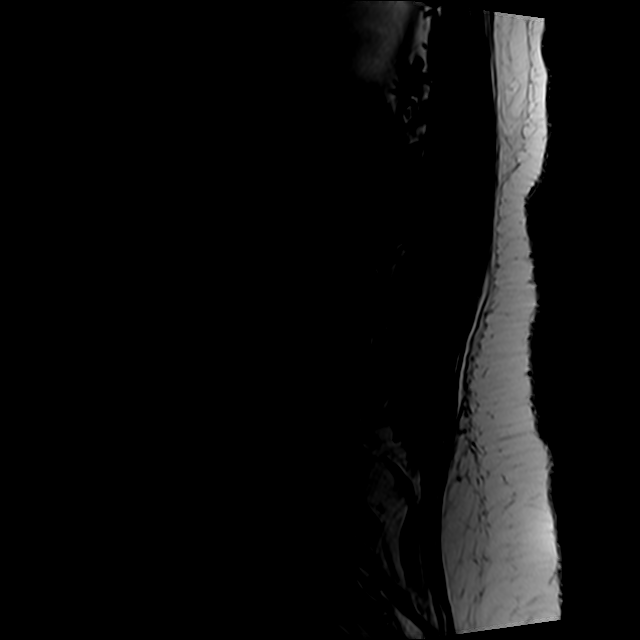
[im 9/15]
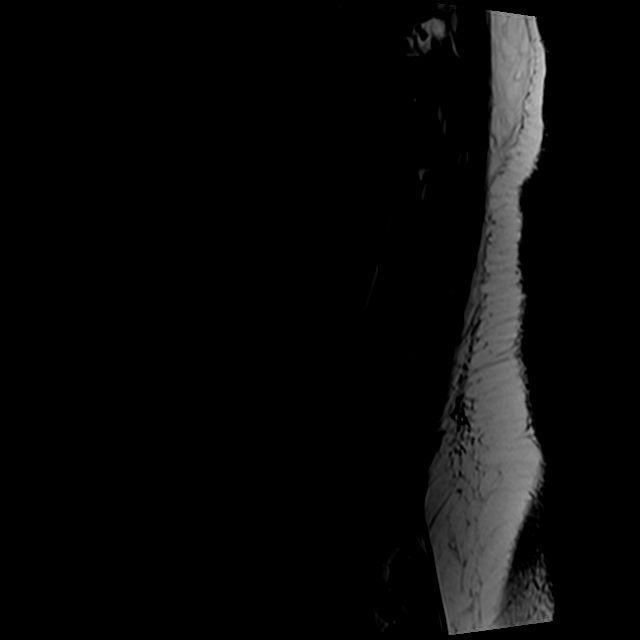
[im 12/15]
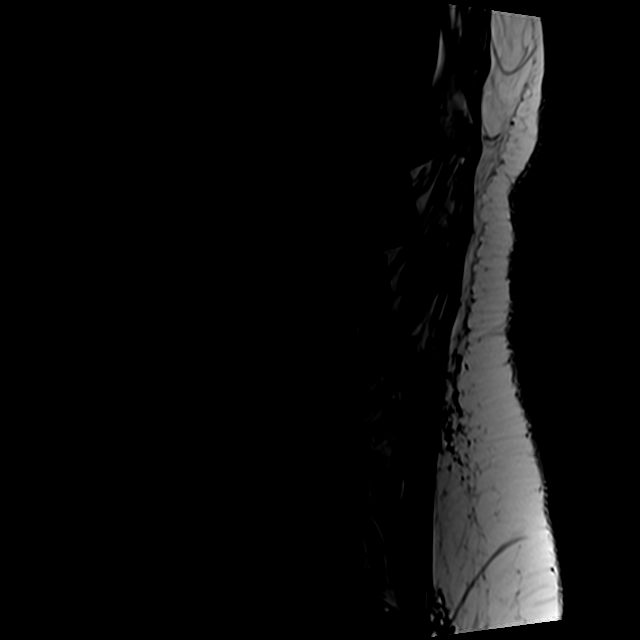
[im 15/15]
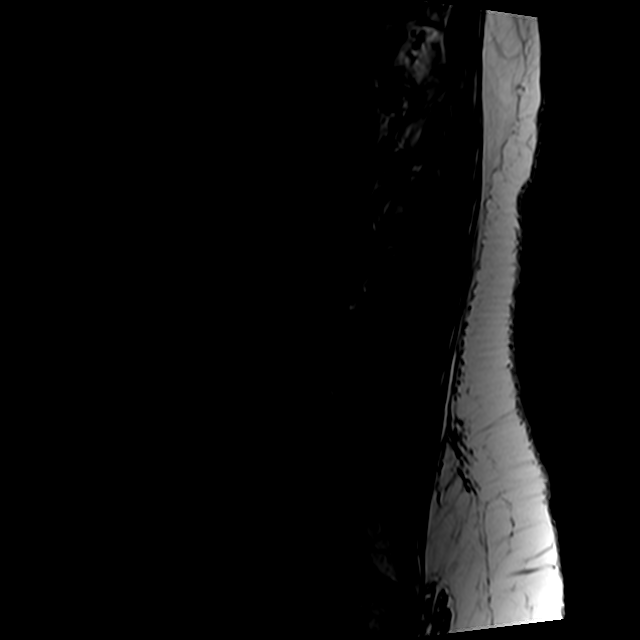

[Series 5: T2 · axial · 4.0mm · 0.78mm/px · z∈[-14,+194]mm · 9 of 37 slices shown (2 of 2)]
[im 1/37]
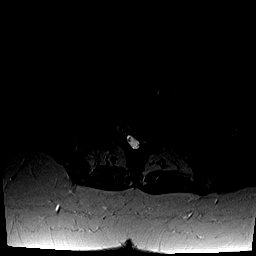
[im 6/37]
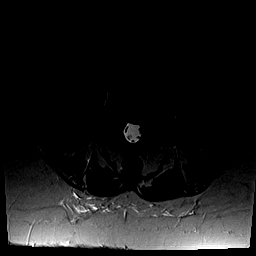
[im 11/37]
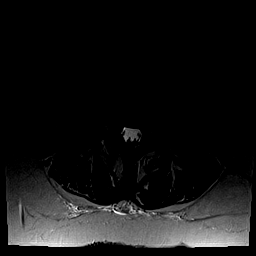
[im 16/37]
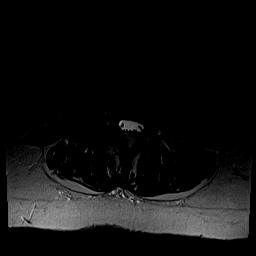
[im 19/37]
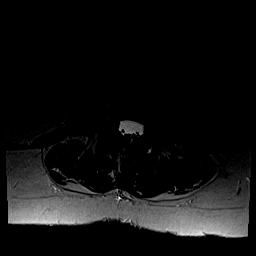
[im 21/37]
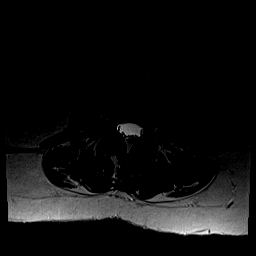
[im 26/37]
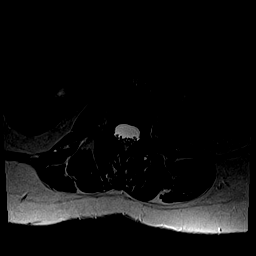
[im 31/37]
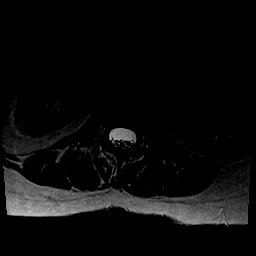
[im 37/37]
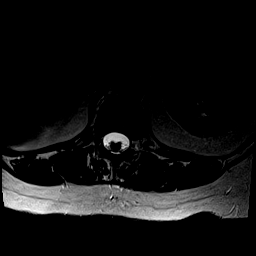

[Series 6: T1 · axial · 4.0mm · 0.31mm/px · z∈[-14,+165]mm · 4 of 37 slices shown (2 of 2)]
[im 1/37]
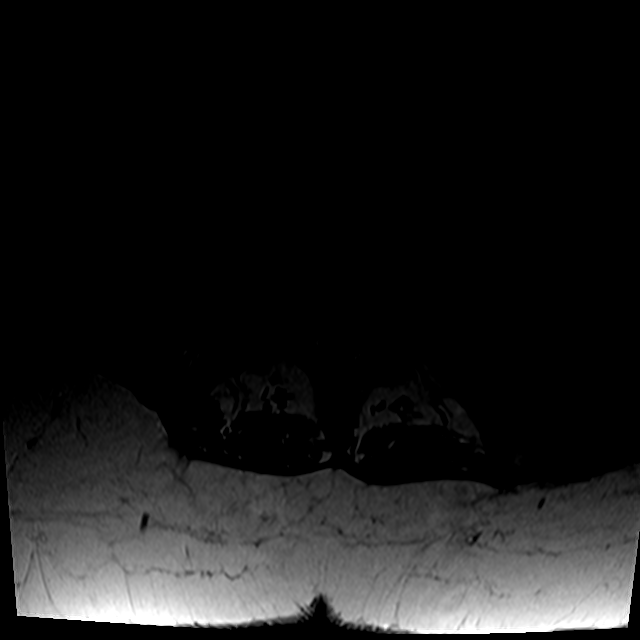
[im 6/37]
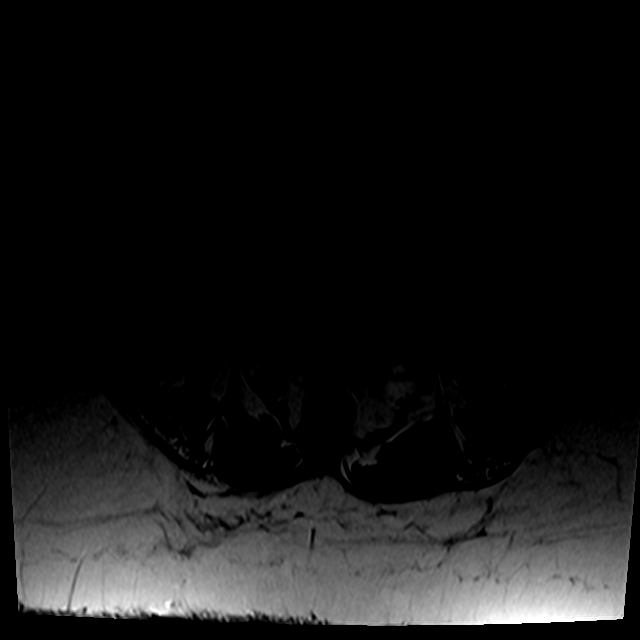
[im 19/37]
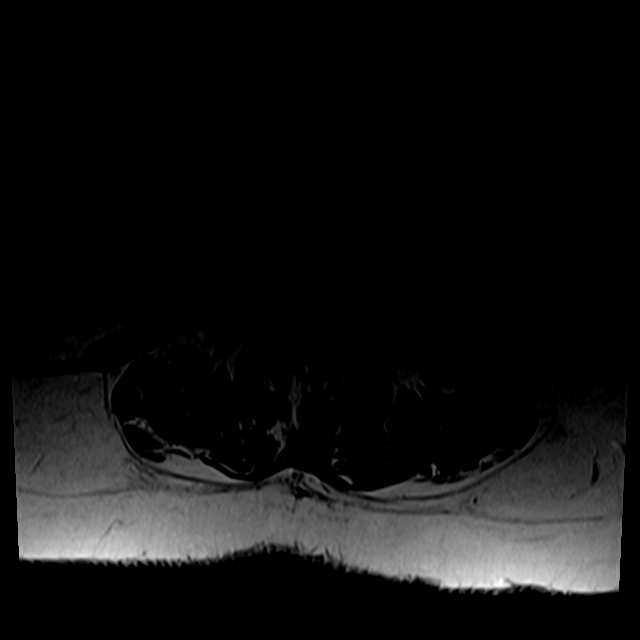
[im 31/37]
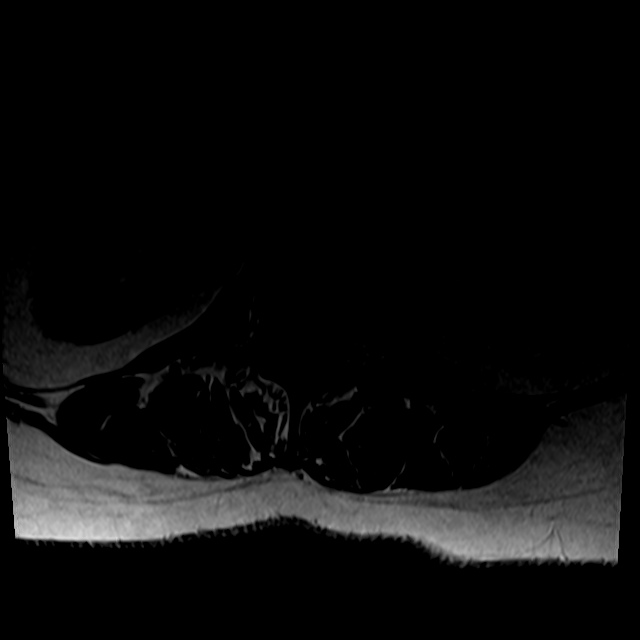

[25 of 48 positions shown; findings below may reference images not displayed]

FINDINGS: There is mild curvature convex to the left. There is no significant
degenerative disc disease. No disc herniation. No stenosis of the
canal or foramina. Minimal facet arthritis at L4-5, possibly
subclinical. No focal osseous lesion. The distal cord and conus are
normal with conus tip at L1.
IMPRESSION: Normal study except for minimal curvature and mild facet arthritis
at L4-5. No advanced or definitely symptomatic finding.

## 2016-03-15 ENCOUNTER — Other Ambulatory Visit: Payer: Self-pay | Admitting: Physician Assistant

## 2016-03-15 DIAGNOSIS — Z1231 Encounter for screening mammogram for malignant neoplasm of breast: Secondary | ICD-10-CM

## 2016-03-25 ENCOUNTER — Ambulatory Visit (INDEPENDENT_AMBULATORY_CARE_PROVIDER_SITE_OTHER): Payer: BLUE CROSS/BLUE SHIELD | Admitting: Physician Assistant

## 2016-03-25 ENCOUNTER — Encounter: Payer: Self-pay | Admitting: Physician Assistant

## 2016-03-25 VITALS — BP 116/76 | HR 72 | Temp 98.2°F | Resp 16 | Wt 170.0 lb

## 2016-03-25 DIAGNOSIS — N3001 Acute cystitis with hematuria: Secondary | ICD-10-CM | POA: Diagnosis not present

## 2016-03-25 DIAGNOSIS — R309 Painful micturition, unspecified: Secondary | ICD-10-CM | POA: Diagnosis not present

## 2016-03-25 LAB — POCT URINALYSIS DIPSTICK
Bilirubin, UA: NEGATIVE
Glucose, UA: NEGATIVE
Ketones, UA: NEGATIVE
Protein, UA: NEGATIVE
Spec Grav, UA: 1.015
Urobilinogen, UA: 0.2
pH, UA: 6.5

## 2016-03-25 MED ORDER — CIPROFLOXACIN HCL 500 MG PO TABS: 500.0000 mg | ORAL_TABLET | Freq: Two times a day (BID) | ORAL | 0 refills | Status: DC

## 2016-03-25 NOTE — Patient Instructions (Signed)

## 2016-03-25 NOTE — Progress Notes (Signed)
       Patient: Dawn Summers Female    DOB: 1966/01/04   50 y.o.   MRN: [id number] Visit Date: 03/25/2016  Today's Provider: Mar Daring, PA-C   Chief Complaint  Patient presents with  . Urinary Tract Infection   Subjective:    HPI Urinary Tract Infection: Patient complains of dysuria, frequency and hematuria. Patient reports that she passed clots twice over the weekend. She has had symptoms for 2-3 days. Patient also complains of back pain. Patient denies fever. Patient does not have a history of recurrent UTI.  Patient does not have a history of pyelonephritis. Patient reports she took a dose AZO.     Allergies  Allergen Reactions  . Latex     Other reaction(s): Unknown  . Penicillins Hives  . Sulfa Antibiotics Nausea And Vomiting  . Trazodone And Nefazodone Nausea Only and Other (See Comments)    dizziness     Current Outpatient Prescriptions:  .  DEXILANT 60 MG capsule, Take 60 mg by mouth daily. , Disp: , Rfl: 11 .  phentermine (ADIPEX-P) 37.5 MG tablet, Take 1 tablet (37.5 mg total) by mouth daily before breakfast. (Patient not taking: Reported on 03/25/2016), Disp: 30 tablet, Rfl: 0  Review of Systems  Constitutional: Negative.   Respiratory: Negative.   Cardiovascular: Negative.   Gastrointestinal: Negative.   Genitourinary: Positive for difficulty urinating, dysuria, flank pain, frequency, hematuria and urgency. Negative for vaginal bleeding, vaginal discharge and vaginal pain.  Musculoskeletal: Positive for back pain.    Social History  Substance Use Topics  . Smoking status: Former Smoker    Quit date: 11/22/2004  . Smokeless tobacco: Never Used  . Alcohol use 0.0 oz/week   Objective:   BP 116/76 (BP Location: Left Arm, Patient Position: Sitting, Cuff Size: Large)   Pulse 72   Temp 98.2 F (36.8 C) (Oral)   Resp 16   Wt 170 lb (77.1 kg)   BMI 28.29 kg/m   Physical Exam  Constitutional: She is oriented to person, place, and time. She appears  well-developed and well-nourished. No distress.  Cardiovascular: Normal rate, regular rhythm and normal heart sounds.  Exam reveals no gallop and no friction rub.   No murmur heard. Pulmonary/Chest: Effort normal and breath sounds normal. No respiratory distress. She has no wheezes. She has no rales.  Abdominal: Soft. Normal appearance and bowel sounds are normal. She exhibits no distension and no mass. There is no hepatosplenomegaly. There is tenderness in the suprapubic area. There is no rebound, no guarding and no CVA tenderness.  Suprapubic pressure  Neurological: She is alert and oriented to person, place, and time.  Skin: Skin is warm and dry. She is not diaphoretic.  Vitals reviewed.     Assessment & Plan:     1. Painful urination UA was positive today for leukocytes and trace hematuria.  - POCT urinalysis dipstick  2. Acute cystitis with hematuria Will treat empirically with cipro as below. Will send for urine culture and adjust treatment pending C&S results. Push fluids. Continue AZO. She is to call if no improvement in symptoms.  - ciprofloxacin (CIPRO) 500 MG tablet; Take 1 tablet (500 mg total) by mouth 2 (two) times daily.  Dispense: 10 tablet; Refill: 0 - Urine Culture       Mar Daring, PA-C  [facility]

## 2016-03-27 LAB — URINE CULTURE

## 2016-03-28 ENCOUNTER — Telehealth: Payer: Self-pay

## 2016-03-28 NOTE — Telephone Encounter (Signed)
lmtcb Emily Drozdowski, CMA  

## 2016-03-28 NOTE — Telephone Encounter (Signed)
-----   Message from Mar Daring, [place] sent at 03/27/2016  5:04 PM EDT ----- Urine culture was positive for e.coli and is susceptible to cipro. Please call if symptoms do not completely resolve once antibiotic is completed.

## 2016-03-28 NOTE — Telephone Encounter (Signed)
Patient advised as directed below.  Thanks,  -Mauria Asquith 

## 2016-04-17 ENCOUNTER — Encounter: Payer: Self-pay | Admitting: Physician Assistant

## 2016-04-17 ENCOUNTER — Ambulatory Visit (INDEPENDENT_AMBULATORY_CARE_PROVIDER_SITE_OTHER): Payer: BLUE CROSS/BLUE SHIELD | Admitting: Physician Assistant

## 2016-04-17 VITALS — BP 120/76 | HR 68 | Temp 97.7°F | Resp 16 | Ht 64.5 in | Wt 173.0 lb

## 2016-04-17 DIAGNOSIS — E663 Overweight: Secondary | ICD-10-CM | POA: Diagnosis not present

## 2016-04-17 DIAGNOSIS — E7800 Pure hypercholesterolemia, unspecified: Secondary | ICD-10-CM | POA: Diagnosis not present

## 2016-04-17 DIAGNOSIS — Z833 Family history of diabetes mellitus: Secondary | ICD-10-CM | POA: Diagnosis not present

## 2016-04-17 DIAGNOSIS — Z23 Encounter for immunization: Secondary | ICD-10-CM | POA: Diagnosis not present

## 2016-04-17 DIAGNOSIS — R030 Elevated blood-pressure reading, without diagnosis of hypertension: Secondary | ICD-10-CM | POA: Diagnosis not present

## 2016-04-17 DIAGNOSIS — R238 Other skin changes: Secondary | ICD-10-CM

## 2016-04-17 DIAGNOSIS — Z0000 Encounter for general adult medical examination without abnormal findings: Secondary | ICD-10-CM

## 2016-04-17 DIAGNOSIS — Z8701 Personal history of pneumonia (recurrent): Secondary | ICD-10-CM | POA: Insufficient documentation

## 2016-04-17 DIAGNOSIS — R233 Spontaneous ecchymoses: Secondary | ICD-10-CM

## 2016-04-17 LAB — POCT URINALYSIS DIPSTICK
Bilirubin, UA: NEGATIVE
Blood, UA: NEGATIVE
Glucose, UA: NEGATIVE
Ketones, UA: NEGATIVE
Leukocytes, UA: NEGATIVE
Nitrite, UA: NEGATIVE
Protein, UA: NEGATIVE
Spec Grav, UA: 1.015
Urobilinogen, UA: 0.2
pH, UA: 6.5

## 2016-04-17 NOTE — Patient Instructions (Signed)

## 2016-04-17 NOTE — Progress Notes (Signed)
Patient: Dawn Summers, Female    DOB: 02-11-1966, 50 y.o.   MRN: [id number] Visit Date: 04/17/2016  Today's Provider: Mar Daring, PA-C   Chief Complaint  Patient presents with  . Annual Exam   Subjective:    Annual physical exam Dawn Summers is a 50 y.o. female who presents today for health maintenance and complete physical. She feels well. She reports exercising no. She reports she is sleeping fairly well.  05/12/15 CPE 12/10/13 pap-neg 06/09/15 Mammogram-BI-RADS 4 06/13/14 Colonoscopy-normal, Dr. Allen Norris -----------------------------------------------------------------   Review of Systems  Constitutional: Negative.   HENT: Negative.   Eyes: Positive for pain.  Respiratory: Negative.   Cardiovascular: Negative.   Gastrointestinal: Positive for constipation.  Endocrine: Negative.   Genitourinary: Negative.   Musculoskeletal: Negative.   Skin: Negative.   Allergic/Immunologic: Negative.   Neurological: Negative.   Hematological: Bruises/bleeds easily.  Psychiatric/Behavioral: Negative.     Social History      She  reports that she quit smoking about 11 years ago. She has never used smokeless tobacco. She reports that she drinks alcohol. She reports that she does not use drugs.       Social History   Social History  . Marital status: Single    Spouse name: N/A  . Number of children: 2  . Years of education: N/A   Social History Main Topics  . Smoking status: Former Smoker    Quit date: 11/22/2004  . Smokeless tobacco: Never Used  . Alcohol use 0.0 oz/week  . Drug use: No  . Sexual activity: Not Asked   Other Topics Concern  . None   Social History Narrative  . None    Past Medical History:  Diagnosis Date  . Breast mass    left  . Cancer (HCC)    cervical  . Carpal tunnel syndrome   . GERD (gastroesophageal reflux disease)   . Hyperlipidemia   . Hypertension   . Insomnia   . Sleep apnea   . Vitamin D deficiency       Patient Active Problem List   Diagnosis Date Noted  . Degeneration of intervertebral disc of lumbar region 08/07/2015  . Neuritis or radiculitis due to rupture of lumbar intervertebral disc 08/07/2015  . Back ache 12/30/2014  . Benign paroxysmal positional nystagmus 12/30/2014  . Breast lump 12/30/2014  . Malignant neoplasm of cervix uteri ([place]) 12/30/2014  . Esophageal reflux 12/30/2014  . Calculus of kidney 12/30/2014  . Bacterial pneumonia 12/30/2014  . Obstructive apnea 12/30/2014  . Snores 12/30/2014  . Blood pressure elevated without history of HTN 08/18/2009  . Radial styloid tenosynovitis 08/15/2009  . Extremity pain 07/12/2009  . Allergic rhinitis 09/18/2007  . Carpal tunnel syndrome 09/18/2007  . Cannot sleep 09/18/2007  . Headache, migraine 09/18/2007  . Hypercholesterolemia without hypertriglyceridemia 09/18/2007    Past Surgical History:  Procedure Laterality Date  . Bladder "Pack"  2010  . CERVICAL SPINE SURGERY     C-3 and C-4 fusion  . VAGINAL HYSTERECTOMY  1994   cervical cancer, ovaries intact    Family History        Family Status  Relation Status  . Mother Deceased  . Father Alive  . Sister Alive  . Brother Alive  . Daughter Alive  . Daughter Alive  . Neg Hx         Her family history includes COPD in her father; Cancer in her father; Colon polyps in her father;  Congestive Heart Failure in her mother; Coronary artery disease in her mother; Diabetes in her mother; Healthy in her daughter and daughter; Hyperlipidemia in her father; Hypertension in her father; Lung disease in her mother; Stroke in her father.    Allergies  Allergen Reactions  . Latex     Other reaction(s): Unknown  . Penicillins Hives  . Sulfa Antibiotics Nausea And Vomiting  . Trazodone And Nefazodone Nausea Only and Other (See Comments)    dizziness    Current Meds  Medication Sig  . DEXILANT 60 MG capsule Take 60 mg by mouth daily.     Patient Care  Team: Mar Daring, PA-C as PCP - General (Family Medicine)     Objective:   Vitals: BP 120/76 (BP Location: Left Arm, Patient Position: Sitting, Cuff Size: Large)   Pulse 68   Temp 97.7 F (36.5 C) (Oral)   Resp 16   Ht 5' 4.5" (1.638 m)   Wt 173 lb (78.5 kg)   SpO2 97%   BMI 29.24 kg/m    Physical Exam  Constitutional: She is oriented to person, place, and time. She appears well-developed and well-nourished. No distress.  HENT:  Head: Normocephalic and atraumatic.  Right Ear: Tympanic membrane, external ear and ear canal normal.  Left Ear: Tympanic membrane, external ear and ear canal normal.  Nose: Nose normal.  Mouth/Throat: Uvula is midline, oropharynx is clear and moist and mucous membranes are normal. No oropharyngeal exudate.  Eyes: Conjunctivae and EOM are normal. Pupils are equal, round, and reactive to light. Right eye exhibits no discharge. Left eye exhibits no discharge. No scleral icterus.  Neck: Normal range of motion. Neck supple. No JVD present. Carotid bruit is not present. No tracheal deviation present. No thyromegaly present.  Cardiovascular: Normal rate, regular rhythm, normal heart sounds and intact distal pulses.  Exam reveals no gallop and no friction rub.   No murmur heard. Pulmonary/Chest: Effort normal and breath sounds normal. No respiratory distress. She has no wheezes. She has no rales. She exhibits no tenderness. Right breast exhibits no inverted nipple, no mass, no nipple discharge, no skin change and no tenderness. Left breast exhibits no inverted nipple, no mass, no nipple discharge, no skin change and no tenderness. Breasts are symmetrical.  Abdominal: Soft. Bowel sounds are normal. She exhibits no distension and no mass. There is no tenderness. There is no rebound and no guarding.  Musculoskeletal: Normal range of motion. She exhibits no edema or tenderness.  Lymphadenopathy:    She has no cervical adenopathy.  Neurological: She is alert  and oriented to person, place, and time.  Skin: Skin is warm and dry. No rash noted. She is not diaphoretic.  Psychiatric: She has a normal mood and affect. Her behavior is normal. Judgment and thought content normal.  Vitals reviewed.   Depression Screen PHQ 2/9 Scores 04/17/2016  PHQ - 2 Score 0    Assessment & Plan:     Routine Health Maintenance and Physical Exam  Exercise Activities and Dietary recommendations Goals    None      Immunization History  Administered Date(s) Administered  . Influenza,inj,Quad PF,36+ Mos 04/20/2015  . Pneumococcal Polysaccharide-23 05/12/2015  . Tdap 11/24/2012    Health Maintenance  Topic Date Due  . HIV Screening  04/25/1979  . INFLUENZA VACCINE  01/23/2016  . PAP SMEAR  12/10/2016  . MAMMOGRAM  06/08/2017  . TETANUS/TDAP  11/25/2022  . COLONOSCOPY  06/13/2024  . Hepatitis C Screening  Completed  Discussed health benefits of physical activity, and encouraged her to engage in regular exercise appropriate for her age and condition.   1. Annual physical exam UA normal. Normal physical exam today. Will check labs as below and f/u pending lab results. If labs are stable and WNL she will not need to have these rechecked for one year at her next annual physical exam. She is to call the office in the meantime if she has any acute issue, questions or concerns. - POCT urinalysis dipstick - TSH  2. Family history of diabetes mellitus in mother Will check labs as below and f/u pending results. - HgB A1c  3. Blood pressure elevated without history of HTN Stable. Will check labs as below and f/u pending results. - Comprehensive Metabolic Panel (CMET)  4. Hypercholesterolemia without hypertriglyceridemia Diet controlled. Will check labs as below and f/u pending results. - Lipid Profile  5. Overweight with body mass index (BMI) 25.0-29.9 Continue portion control and exercise.  6. Bruises easily Will check labs as below and f/u  pending results. - CBC w/Diff/Platelet  7. Need for influenza vaccination Flu vaccine given today without complication. Patient sat upright for 15 minutes to check for adverse reaction before being released. - Flu Vaccine QUAD 36+ mos IM   --------------------------------------------------------------------    Mar Daring, PA-C  [facility]

## 2016-06-05 ENCOUNTER — Ambulatory Visit: Payer: BLUE CROSS/BLUE SHIELD

## 2016-06-13 ENCOUNTER — Ambulatory Visit
Admission: RE | Admit: 2016-06-13 | Discharge: 2016-06-13 | Disposition: A | Discharge status: Home / Self Care | Payer: BLUE CROSS/BLUE SHIELD | Source: Ambulatory Visit | Attending: Physician Assistant | Admitting: Physician Assistant

## 2016-06-13 ENCOUNTER — Other Ambulatory Visit: Payer: Self-pay

## 2016-06-13 ENCOUNTER — Telehealth: Payer: Self-pay | Admitting: Gastroenterology

## 2016-06-13 DIAGNOSIS — Z1231 Encounter for screening mammogram for malignant neoplasm of breast: Secondary | ICD-10-CM | POA: Diagnosis not present

## 2016-06-13 MED ORDER — DEXLANSOPRAZOLE 60 MG PO CPDR: 60.0000 mg | DELAYED_RELEASE_CAPSULE | Freq: Every day | ORAL | 11 refills | Status: DC

## 2016-06-13 NOTE — Telephone Encounter (Signed)
Patient needs a refill on Dexilant 60mg  called  Into [facility] in [place].

## 2016-06-13 NOTE — Telephone Encounter (Signed)
Rx sent to pt's pharmacy per her request.  

## 2016-06-14 ENCOUNTER — Telehealth: Payer: Self-pay

## 2016-06-14 NOTE — Telephone Encounter (Signed)
lmtcb Emily Drozdowski, CMA  

## 2016-06-14 NOTE — Telephone Encounter (Signed)
-----   Message from Mar Daring, [place] sent at 06/13/2016  5:13 PM EST ----- Normal mammogram. Repeat screening in one year.

## 2016-06-14 NOTE — Telephone Encounter (Signed)
Patient advised as directed below.  Thanks,  -Lorenna Lurry 

## 2016-07-29 ENCOUNTER — Other Ambulatory Visit: Payer: Self-pay | Admitting: Gastroenterology

## 2016-07-29 DIAGNOSIS — K2100 Gastro-esophageal reflux disease with esophagitis, without bleeding: Secondary | ICD-10-CM

## 2016-10-15 ENCOUNTER — Ambulatory Visit (INDEPENDENT_AMBULATORY_CARE_PROVIDER_SITE_OTHER): Payer: BLUE CROSS/BLUE SHIELD | Admitting: Physician Assistant

## 2016-10-15 ENCOUNTER — Encounter: Payer: Self-pay | Admitting: Physician Assistant

## 2016-10-15 VITALS — BP 138/80 | HR 84 | Temp 97.8°F | Resp 16 | Wt 171.0 lb

## 2016-10-15 DIAGNOSIS — N3090 Cystitis, unspecified without hematuria: Secondary | ICD-10-CM | POA: Diagnosis not present

## 2016-10-15 DIAGNOSIS — M546 Pain in thoracic spine: Secondary | ICD-10-CM | POA: Diagnosis not present

## 2016-10-15 LAB — POCT URINALYSIS DIPSTICK
Glucose, UA: NEGATIVE
Spec Grav, UA: 1.015 (ref 1.010–1.025)

## 2016-10-15 MED ORDER — CIPROFLOXACIN HCL 250 MG PO TABS: 250.0000 mg | ORAL_TABLET | Freq: Two times a day (BID) | ORAL | 0 refills | Status: DC

## 2016-10-15 NOTE — Progress Notes (Signed)
[place]  Chief Complaint  Patient presents with  . Urinary Tract Infection    Started a few days ago.    Subjective:    Patient ID: Dawn Summers, female    DOB: January 18, 1966, 51 y.o.   MRN: [id number]   Urinary Tract Infection: Patient complains of frequency She has had symptoms for a few days. Patient also complains of back pain. Patient denies fever and vaginal discharge. Patient does have a history of recurrent UTI.  Patient does not have a history of pyelonephritis, but does have a history of renal stones most recently in 2015. Patient denies vaginal discharge and denies new sexual partners. The patient denies recent travel outside of the [place]. Last bowel movement today, normal. Tolerating PO with no vomiting, but some nausea. Had episode of increased pain that took he rbreath away but has since calmed down. Pain is in flank area.   Review of Systems  Constitutional: Positive for malaise/fatigue. Negative for chills, diaphoresis, fever and weight loss.  Gastrointestinal: Positive for nausea. Negative for abdominal pain, blood in stool, constipation, diarrhea, heartburn, melena and vomiting.  Genitourinary: Positive for flank pain, frequency, hematuria and urgency. Negative for dysuria.  Musculoskeletal: Positive for back pain.  Skin: Negative for rash.  Neurological: Negative for dizziness, weakness and headaches.       Objective:   BP 138/80 (BP Location: Left Arm, Patient Position: Sitting, Cuff Size: Normal)   Pulse 84   Temp 97.8 F (36.6 C) (Oral)   Resp 16   Wt 171 lb (77.6 kg)   BMI 28.90 kg/m   Patient Active Problem List   Diagnosis Date Noted  . H/O: pneumonia 04/17/2016  . Degeneration of intervertebral disc of lumbar region 08/07/2015  . Neuritis or radiculitis due to rupture of lumbar intervertebral disc 08/07/2015  . Back ache 12/30/2014  . Benign paroxysmal positional nystagmus 12/30/2014  . Breast lump  12/30/2014  . Malignant neoplasm of cervix uteri ([place]) 12/30/2014  . Esophageal reflux 12/30/2014  . Calculus of kidney 12/30/2014  . Obstructive apnea 12/30/2014  . Snores 12/30/2014  . Blood pressure elevated without history of HTN 08/18/2009  . Radial styloid tenosynovitis 08/15/2009  . Allergic rhinitis 09/18/2007  . Carpal tunnel syndrome 09/18/2007  . Cannot sleep 09/18/2007  . Headache, migraine 09/18/2007  . Hypercholesterolemia without hypertriglyceridemia 09/18/2007    Outpatient Encounter Prescriptions as of 10/15/2016  Medication Sig Note  . dexlansoprazole (DEXILANT) 60 MG capsule Take 1 capsule (60 mg total) by mouth daily.   Marland Kitchen DEXILANT 60 MG capsule Take 60 mg by mouth daily.  11/09/2015: Received from: External Pharmacy   No facility-administered encounter medications on file as of 10/15/2016.     Allergies  Allergen Reactions  . Latex     Other reaction(s): Unknown  . Penicillins Hives  . Sulfa Antibiotics Nausea And Vomiting  . Trazodone And Nefazodone Nausea Only and Other (See Comments)    dizziness       Physical Exam  Constitutional: She appears well-developed and well-nourished. She does not appear ill. No distress.  Cardiovascular: Normal rate.   Pulmonary/Chest: Effort normal and breath sounds normal.  Abdominal: Soft. Bowel sounds are normal. She exhibits no distension and no mass. There is no tenderness. There is no rebound, no guarding and no CVA tenderness.  Skin: Skin is warm and dry.  Psychiatric: She has a normal mood and affect. Her behavior is normal.       Assessment &  Plan:   1. Cystitis  Cystitis vs. pyelnonephritis vs. MSK pain. Patient nontoxic afebrile in office today. Took azo tablet so urine dip unreliable. Will send for UA and culture, cover with cipro until then. Counseled on return precautions.   - POCT urinalysis dipstick - Urine Culture - Urinalysis, microscopic only - ciprofloxacin (CIPRO) 250 MG tablet; Take 1 tablet  (250 mg total) by mouth 2 (two) times daily.  Dispense: 6 tablet; Refill: 0  2. Bilateral thoracic back pain, unspecified chronicity  See above.  Return if symptoms worsen or fail to improve.  The entirety of the information documented in the History of Present Illness, Review of Systems and Physical Exam were personally obtained by me. Portions of this information were initially documented by Ashley Royalty, CMA and reviewed by me for thoroughness and accuracy.

## 2016-10-15 NOTE — Patient Instructions (Signed)

## 2016-10-16 LAB — PLEASE NOTE

## 2016-10-16 LAB — URINALYSIS, MICROSCOPIC ONLY: Casts: NONE SEEN /lpf

## 2016-10-17 LAB — PLEASE NOTE

## 2016-10-17 LAB — URINE CULTURE: Organism ID, Bacteria: NO GROWTH

## 2016-10-17 NOTE — Progress Notes (Signed)
Advised  ED 

## 2016-11-21 ENCOUNTER — Ambulatory Visit (INDEPENDENT_AMBULATORY_CARE_PROVIDER_SITE_OTHER): Payer: BLUE CROSS/BLUE SHIELD | Admitting: Physician Assistant

## 2016-11-21 ENCOUNTER — Encounter: Payer: Self-pay | Admitting: Physician Assistant

## 2016-11-21 VITALS — BP 120/76 | HR 76 | Temp 98.3°F | Resp 16 | Wt 176.0 lb

## 2016-11-21 DIAGNOSIS — N644 Mastodynia: Secondary | ICD-10-CM | POA: Diagnosis not present

## 2016-11-21 DIAGNOSIS — N6310 Unspecified lump in the right breast, unspecified quadrant: Secondary | ICD-10-CM

## 2016-11-21 MED ORDER — DOXYCYCLINE HYCLATE 100 MG PO TABS: 100.0000 mg | ORAL_TABLET | Freq: Two times a day (BID) | ORAL | 0 refills | Status: AC

## 2016-11-21 NOTE — Progress Notes (Signed)
Patient: Dawn Summers Female    DOB: 08/17/1965   51 y.o.   MRN: [id number] Visit Date: 11/21/2016  Today's Provider: Trinna Post, PA-C   Chief Complaint  Patient presents with  . Breast Pain   Subjective:    HPI  Dawn Summers is a 51 y/o woman with history of normal mammogram in 05/2016 reporting today with right breast pain since yesterday morning. Patient reports that it is very tender to touch. She reports there is no drainage. She denies any injury. She does not have any fevers, chills, nausea, vomiting. She denies IVDU or any injuries. Left breast is normal.    Allergies  Allergen Reactions  . Latex     Other reaction(s): Unknown  . Penicillins Hives  . Sulfa Antibiotics Nausea And Vomiting  . Trazodone And Nefazodone Nausea Only and Other (See Comments)    dizziness     Current Outpatient Prescriptions:  .  dexlansoprazole (DEXILANT) 60 MG capsule, Take 1 capsule (60 mg total) by mouth daily., Disp: 30 capsule, Rfl: 11 .  ciprofloxacin (CIPRO) 250 MG tablet, Take 1 tablet (250 mg total) by mouth 2 (two) times daily. (Patient not taking: Reported on 11/21/2016), Disp: 6 tablet, Rfl: 0 .  DEXILANT 60 MG capsule, Take 60 mg by mouth daily. , Disp: , Rfl: 11  Review of Systems  Constitutional: Negative.   Musculoskeletal: Positive for myalgias.    Social History  Substance Use Topics  . Smoking status: Former Smoker    Quit date: 11/22/2004  . Smokeless tobacco: Never Used  . Alcohol use 0.0 oz/week   Objective:   BP 120/76 (BP Location: Right Arm, Patient Position: Sitting, Cuff Size: Normal)   Pulse 76   Temp 98.3 F (36.8 C)   Resp 16   Wt 176 lb (79.8 kg)   SpO2 98%   BMI 29.74 kg/m  Vitals:   11/21/16 1412  BP: 120/76  Pulse: 76  Resp: 16  Temp: 98.3 F (36.8 C)  SpO2: 98%  Weight: 176 lb (79.8 kg)     Physical Exam  Constitutional: She is oriented to person, place, and time. She appears well-developed and well-nourished. No  distress.  Cardiovascular: Normal rate.   Pulmonary/Chest: Effort normal. Right breast exhibits inverted nipple and mass. Right breast exhibits no nipple discharge, no skin change and no tenderness. Left breast exhibits inverted nipple. Left breast exhibits no mass, no nipple discharge, no skin change and no tenderness. Breasts are symmetrical.  Right breast mass at nine o'clock position with exquisite tenderness to palpation. Some erythema of overlying skin but no other skin changes. Does not feel hard, discrete, or immobile.   Neurological: She is oriented to person, place, and time.  Skin: Skin is warm and dry. There is erythema.  Psychiatric: She has a normal mood and affect. Her behavior is normal.        Assessment & Plan:     1. Breast mass, right  Eval and treat as below. DDx: cellulitis, early abscess, infected cyst, mastitis, possible malignancy. She will call back if not improving in the next few days.  - US BREAST LTD UNI RIGHT INC AXILLA; Future - doxycycline (VIBRA-TABS) 100 MG tablet; Take 1 tablet (100 mg total) by mouth 2 (two) times daily.  Dispense: 14 tablet; Refill: 0 - [facility] Un[facility] R; Future  2. Breast pain, right  - US BREAST LTD UNI RIGHT INC AXILLA; Future - doxycycline (VIBRA-TABS) 100  MG tablet; Take 1 tablet (100 mg total) by mouth 2 (two) times daily.  Dispense: 14 tablet; Refill: 0 - [facility] Digital Diagnostic Unilat R; Future  The entirety of the information documented in the History of Present Illness, Review of Systems and Physical Exam were personally obtained by me. Portions of this information were initially documented by Wilburt Finlay and reviewed by me for thoroughness and accuracy.     No Follow-up on file.       Trinna Post, PA-C  [facility]

## 2016-11-21 NOTE — Patient Instructions (Signed)
Breast Cyst A breast cyst is a sac in the breast that is filled with fluid. Breast cysts are usually noncancerous (benign). They are common among women, and they are most often located in the upper, outer portion of the breast. One or more cysts may develop. They form when fluid builds up inside of the breast glands. There are several types of breast cysts:  Macrocyst. This is a cyst that is about 2 inches (5.1 cm) across (in diameter).  Microcyst. This is a very small cyst that you cannot feel, but it can be seen with imaging tests such as an X-ray of the breast (mammogram) or ultrasound.  Galactocele. This is a cyst that contains milk. It may develop if you suddenly stop breastfeeding.  Breast cysts do not increase your risk of breast cancer. They usually disappear after menopause, unless you take artificial hormones (are on hormone therapy). What are the causes? The exact cause of breast cysts is not known. Possible causes include:  Blockage of tubes (ducts) in the breast glands, which leads to fluid buildup. Duct blockage may result from: ? Fibrocystic breast changes. This is a common, benign condition that occurs when women go through hormonal changes during the menstrual cycle. This is a common cause of multiple breast cysts. ? Overgrowth of breast tissue or breast glands. ? Scar tissue in the breast from previous surgery.  Changes in certain female hormones (estrogen and progesterone).  What increases the risk? You may be more likely to develop breast cysts if you have not gone through menopause. What are the signs or symptoms? Symptoms of a breast cyst may include:  Feeling one or more smooth, round, soft lumps (like grapes) in the breast that are easily moveable. The lump(s) may get bigger and more painful before your period and get smaller after your period.  Breast discomfort or pain.  How is this diagnosed? A cyst can be felt during a physical exam by your health care  provider. A mammogram and ultrasound will be done to confirm the diagnosis. Fluid may be removed from the cyst with a needle (fine-needle aspiration) and tested to make sure the cyst is not cancerous. How is this treated? Treatment may not be necessary. Your health care provider may monitor the cyst to see if it goes away on its own. If the cyst is uncomfortable or gets bigger, or if you do not like how the cyst makes your breast look, you may need treatment. Treatment may include:  Hormone treatment.  Fine-needle aspiration, to drain fluid from the cyst. There is a chance of the cyst coming back (recurring) after aspiration.  Surgery to remove the cyst.  Follow these instructions at home:  See your health care provider regularly. ? Get a yearly physical exam. ? If you are 20-40 years old, get a clinical breast exam every 1-3 years. After age 40, get this exam every year. ? Get mammograms as often as directed.  Do a breast self-exam every month, or as often as directed. Having many breast cysts, or "lumpy" breasts, may make it harder to feel for new lumps. Understand how your breasts normally look and feel, and write down any changes in your breasts so you can tell your health care provider about the changes. A breast self-exam involves: ? Comparing your breasts in the mirror. ? Looking for visible changes in your skin or nipples. ? Feeling for lumps or changes.  Take over-the-counter and prescription medicines only as told by your health care   provider.  Wear a supportive bra, especially when exercising.  Follow instructions from your health care provider about eating and drinking restrictions. ? Avoid caffeine. ? Cut down on salt (sodium) in what you eat and drink, especially before your menstrual period. Too much sodium can cause fluid buildup (retention), breast swelling, and discomfort.  Keep all follow-up visits as told your health care provider. This is important. Contact a  health care provider if:  You feel, or think you feel, a lump in your breast.  You notice that both breasts look or feel different than usual.  Your breast is still causing pain after your menstrual period is over.  You find new lumps or bumps that were not there before.  You feel lumps in your armpit (axilla). Get help right away if:  You have severe pain, tenderness, redness, or warmth in your breast.  You have fluid or blood leaking from your nipple.  Your breast lump becomes hard and painful.  You notice dimpling or wrinkling of the breast or nipple. This information is not intended to replace advice given to you by your health care provider. Make sure you discuss any questions you have with your health care provider. Document Released: 06/10/2005 Document Revised: 03/01/2016 Document Reviewed: 03/01/2016 Elsevier Interactive Patient Education  2017 Elsevier Inc.  

## 2016-11-21 NOTE — Addendum Note (Signed)
Addended by: Trinna Post on: 11/21/2016 05:00 PM   Modules accepted: Orders

## 2016-11-22 ENCOUNTER — Telehealth: Payer: Self-pay

## 2016-11-22 NOTE — Telephone Encounter (Signed)
Patient called and states she was here for breast issue on 11/21/16 and saw Carles Collet. She has been started on Doxy and order for mammogram and [place] was placed and appointment is made for 11/29/16. Patient states she has noticed that nipple is "sinking in" more, inverted more. Counseled with [employer] and advised patient to continue with Doxy there is nothing we can do at this time until we get imaging reports.-aa

## 2016-11-29 ENCOUNTER — Ambulatory Visit
Admission: RE | Admit: 2016-11-29 | Discharge: 2016-11-29 | Disposition: A | Discharge status: Home / Self Care | Payer: BLUE CROSS/BLUE SHIELD | Source: Ambulatory Visit | Attending: Physician Assistant | Admitting: Physician Assistant

## 2016-11-29 DIAGNOSIS — N644 Mastodynia: Secondary | ICD-10-CM | POA: Diagnosis not present

## 2016-11-29 DIAGNOSIS — N6310 Unspecified lump in the right breast, unspecified quadrant: Secondary | ICD-10-CM

## 2016-12-03 ENCOUNTER — Telehealth: Payer: Self-pay

## 2016-12-03 NOTE — Telephone Encounter (Signed)
LMTCB 12/03/2016  Thanks,   -Mickel Baas

## 2016-12-03 NOTE — Telephone Encounter (Signed)
-----   Message from Trinna Post, [place] sent at 11/29/2016  3:47 PM EDT ----- Small benign cyst noted on ultrasound, but otherwise normal ultrasound and mammography. How is patient doing?

## 2016-12-04 NOTE — Telephone Encounter (Signed)
Pt advised.  She reports the pain is improving.   Thanks,   -Mickel Baas

## 2017-02-27 ENCOUNTER — Ambulatory Visit
Admission: RE | Admit: 2017-02-27 | Discharge: 2017-02-27 | Disposition: A | Discharge status: Home / Self Care | Payer: BLUE CROSS/BLUE SHIELD | Source: Ambulatory Visit | Attending: Physician Assistant | Admitting: Physician Assistant

## 2017-02-27 ENCOUNTER — Ambulatory Visit (INDEPENDENT_AMBULATORY_CARE_PROVIDER_SITE_OTHER): Payer: BLUE CROSS/BLUE SHIELD | Admitting: Physician Assistant

## 2017-02-27 ENCOUNTER — Encounter: Payer: Self-pay | Admitting: Physician Assistant

## 2017-02-27 VITALS — BP 120/70 | HR 72 | Temp 98.0°F | Resp 16 | Wt 176.0 lb

## 2017-02-27 DIAGNOSIS — M79671 Pain in right foot: Secondary | ICD-10-CM

## 2017-02-27 DIAGNOSIS — M25474 Effusion, right foot: Secondary | ICD-10-CM | POA: Insufficient documentation

## 2017-02-27 MED ORDER — IBUPROFEN 600 MG PO TABS: 600.0000 mg | ORAL_TABLET | Freq: Three times a day (TID) | ORAL | 0 refills | Status: DC | PRN

## 2017-02-27 NOTE — Patient Instructions (Signed)
Foot Sprain A foot sprain is an injury to one of the strong bands of tissue (ligaments) that connect and support the many bones in your feet. The ligament can be stretched too much or it can tear. A tear can be either partial or complete. The severity of the sprain depends on how much of the ligament was damaged or torn. What are the causes? A foot sprain is usually caused by suddenly twisting or pivoting your foot. What increases the risk? This injury is more likely to occur in people who:  Play a sport, such as basketball or football.  Exercise or play a sport without warming up.  Start a new workout or sport.  Suddenly increase how long or hard they exercise or play a sport.  What are the signs or symptoms? Symptoms of this condition start soon after an injury and include:  Pain, especially in the arch of the foot.  Bruising.  Swelling.  Inability to walk or use the foot to support body weight.  How is this diagnosed? This condition is diagnosed with a medical history and physical exam. You may also have imaging tests, such as:  X-rays to make sure there are no broken bones (fractures).  MRI to see if the ligament has torn.  How is this treated? Treatment varies depending on the severity of your sprain. Mild sprains can be treated with rest, ice, compression, and elevation (RICE). If your ligament is overstretched or partially torn, treatment usually involves keeping your foot in a fixed position (immobilization) for a period of time. To help you do this, your health care provider will apply a bandage, splint, or walking boot to keep your foot from moving until it heals. You may also be advised to use crutches or a scooter for a few weeks to avoid bearing weight on your foot while it is healing. If your ligament is fully torn, you may need surgery to reconnect the ligament to the bone. After surgery, a cast or splint will be applied and will need to stay on your foot while it  heals. Your health care provider may also suggest exercises or physical therapy to strengthen your foot. Follow these instructions at home: If You Have a Bandage, Splint, or Walking Boot:  Wear it as directed by your health care provider. Remove it only as directed by your health care provider.  Loosen the bandage, splint, or walking boot if your toes become numb and tingle, or if they turn cold and blue. Bathing  If your health care provider approves bathing and showering, cover the bandage or splint with a watertight plastic bag to protect it from water. Do not let the bandage or splint get wet. Managing pain, stiffness, and swelling  If directed, apply ice to the injured area: ? Put ice in a plastic bag. ? Place a towel between your skin and the bag. ? Leave the ice on for 20 minutes, 2-3 times per day.  Move your toes often to avoid stiffness and to lessen swelling.  Raise (elevate) the injured area above the level of your heart while you are sitting or lying down. Driving  Do not drive or operate heavy machinery while taking pain medicine.  Ask your health care provider when it is safe to drive if you have a bandage, splint, or walking boot on your foot. Activity  Rest as directed by your health care provider.  Do not use the injured foot to support your body weight until your health   care provider says that you can. Use crutches or other supportive devices as directed by your health care provider.  Ask your health care provider what activities are safe for you. Gradually increase how much and how far you walk until your health care provider says it is safe to return to full activity.  Do any exercise or physical therapy as directed by your health care provider. General instructions  If a splint was applied, do not put pressure on any part of it until it is fully hardened. This may take several hours.  Take medicines only as directed by your health care provider. These  include over-the-counter medicines and prescription medicines.  Keep all follow-up visits as directed by your health care provider. This is important.  When you can walk without pain, wear supportive shoes that have stiff soles. Do not wear flip-flops, and do not walk barefoot. Contact a health care provider if:  Your pain is not controlled with medicine.  Your bruising or swelling gets worse or does not get better with treatment.  Your splint or walking boot is damaged. Get help right away if:  You develop severe numbness or tingling in your foot.  Your foot turns blue, white, or gray, and it feels cold. This information is not intended to replace advice given to you by your health care provider. Make sure you discuss any questions you have with your health care provider. Document Released: 11/30/2001 Document Revised: 11/16/2015 Document Reviewed: 04/13/2014 Elsevier Interactive Patient Education  2018 Elsevier Inc.  

## 2017-02-27 NOTE — Progress Notes (Signed)
Patient: Dawn Summers Female    DOB: 05-27-1966   51 y.o.   MRN: [id number] Visit Date: 02/27/2017  Today's Provider: Mar Daring, PA-C   Chief Complaint  Patient presents with  . Ankle Pain   Subjective:    Ankle Pain   The incident occurred more than 1 week ago. The incident occurred at home. Injury mechanism: she hit the side of her bed. The pain is present in the right ankle. The quality of the pain is described as burning and aching. The pain is at a severity of 7/10. The pain is moderate. The pain has been constant since onset. Associated symptoms include numbness and tingling. She reports no foreign bodies present. The symptoms are aggravated by weight bearing. She has tried nothing for the symptoms.      Allergies  Allergen Reactions  . Latex     Other reaction(s): Unknown  . Penicillins Hives  . Sulfa Antibiotics Nausea And Vomiting  . Trazodone And Nefazodone Nausea Only and Other (See Comments)    dizziness     Current Outpatient Prescriptions:  .  dexlansoprazole (DEXILANT) 60 MG capsule, Take 1 capsule (60 mg total) by mouth daily., Disp: 30 capsule, Rfl: 11  Review of Systems  Respiratory: Negative.   Cardiovascular: Negative for chest pain, palpitations and leg swelling.  Gastrointestinal: Negative.   Musculoskeletal: Positive for arthralgias, gait problem (antalgic) and joint swelling.  Neurological: Positive for tingling, weakness and numbness.    Social History  Substance Use Topics  . Smoking status: Former Smoker    Quit date: 11/22/2004  . Smokeless tobacco: Never Used  . Alcohol use 0.0 oz/week   Objective:   BP 120/70 (BP Location: Left Arm, Patient Position: Sitting, Cuff Size: Normal)   Pulse 72   Temp 98 F (36.7 C) (Oral)   Resp 16   Wt 176 lb (79.8 kg)   BMI 29.74 kg/m     Physical Exam  Constitutional: She appears well-developed and well-nourished. No distress.  Neck: Normal range of motion. Neck supple.    Cardiovascular: Normal rate, regular rhythm and normal heart sounds.  Exam reveals no gallop and no friction rub.   No murmur heard. Pulmonary/Chest: Effort normal and breath sounds normal. No respiratory distress. She has no wheezes. She has no rales.  Musculoskeletal:       Right ankle: She exhibits swelling. She exhibits normal range of motion, no laceration and normal pulse. Tenderness. AITFL tenderness found. No proximal fibula tenderness found. Achilles tendon normal.       Right foot: There is tenderness and swelling. There is normal range of motion, no bony tenderness, normal capillary refill, no crepitus, no deformity and no laceration.       Feet:  Skin: She is not diaphoretic.  Vitals reviewed.       Assessment & Plan:     1. Right foot pain Suspect bone bruise vs fracture of cuboid possibly. May have injured lateral malleolus but most tenderness is below this area. Advised patient to rest, use ice and heat intermittently, apply compression as needed, and to elevate when she can. Wear shoes with good ankle support. Imaging ordered as below to R/O fracture. I will f/u pending results. IBU given as below for anti-inflammatory properties. She is to call the office if symptoms worsen.  - DG Foot Complete Right; Future - DG Ankle Complete Right; Future - ibuprofen (ADVIL,MOTRIN) 600 MG tablet; Take 1 tablet (600 mg total)  by mouth every 8 (eight) hours as needed.  Dispense: 30 tablet; Refill: 0  2. Swelling of foot joint, right See above medical treatment plan. - DG Foot Complete Right; Future - DG Ankle Complete Right; Future - ibuprofen (ADVIL,MOTRIN) 600 MG tablet; Take 1 tablet (600 mg total) by mouth every 8 (eight) hours as needed.  Dispense: 30 tablet; Refill: 0       Mar Daring, PA-C  [facility]

## 2017-03-06 ENCOUNTER — Other Ambulatory Visit: Payer: Self-pay | Admitting: Physician Assistant

## 2017-03-06 DIAGNOSIS — M79671 Pain in right foot: Secondary | ICD-10-CM

## 2017-03-06 DIAGNOSIS — M25474 Effusion, right foot: Secondary | ICD-10-CM

## 2017-05-29 ENCOUNTER — Ambulatory Visit: Payer: BLUE CROSS/BLUE SHIELD | Admitting: Physician Assistant

## 2017-05-29 ENCOUNTER — Encounter: Payer: Self-pay | Admitting: Physician Assistant

## 2017-05-29 VITALS — BP 116/74 | HR 68 | Temp 97.8°F | Resp 16 | Wt 180.0 lb

## 2017-05-29 DIAGNOSIS — J0120 Acute ethmoidal sinusitis, unspecified: Secondary | ICD-10-CM | POA: Diagnosis not present

## 2017-05-29 MED ORDER — DOXYCYCLINE HYCLATE 100 MG PO TABS: 100.0000 mg | ORAL_TABLET | Freq: Two times a day (BID) | ORAL | 0 refills | Status: AC

## 2017-05-29 NOTE — Progress Notes (Signed)
[place]  Chief Complaint  Patient presents with  . URI    Started about two weeks ago.  . Sinusitis    Subjective:    Patient ID: Dawn Summers, female    DOB: Dec 23, 1965, 51 y.o.   MRN: [id number]  Upper Respiratory Infection: Dawn Summers is a  51 y.o. female with a past medical history significant for previous tobacco abuse complaining of symptoms of a URI, possible sinusitis. Symptoms include congestion, cough and plugged sensation in both ears. Onset of symptoms was 2 weeks ago, gradually worsening since that time. She also c/o congestion, nasal congestion, non productive cough and post nasal drip for the past 2 weeks .  She is drinking plenty of fluids. Evaluation to date: none. Treatment to date: Vicks. The treatment has provided minimal.   Review of Systems  Constitutional: Positive for fatigue. Negative for activity change, appetite change, chills, diaphoresis, fever and unexpected weight change.  HENT: Positive for congestion, nosebleeds, postnasal drip, rhinorrhea, sinus pressure, sinus pain and sneezing. Negative for ear discharge, ear pain, mouth sores, sore throat, tinnitus, trouble swallowing and voice change.   Eyes: Positive for discharge and itching.  Respiratory: Positive for cough, chest tightness and shortness of breath. Negative for apnea, choking, wheezing and stridor.   Gastrointestinal: Negative.   Neurological: Negative for dizziness, light-headedness and headaches.       Objective:   BP 116/74 (BP Location: Left Arm, Patient Position: Sitting, Cuff Size: Normal)   Pulse 68   Temp 97.8 F (36.6 C) (Oral)   Resp 16   Wt 180 lb (81.6 kg)   BMI 30.42 kg/m   Patient Active Problem List   Diagnosis Date Noted  . H/O: pneumonia 04/17/2016  . Degeneration of intervertebral disc of lumbar region 08/07/2015  . Neuritis or radiculitis due to rupture of lumbar intervertebral disc 08/07/2015  . Back ache 12/30/2014   . Benign paroxysmal positional nystagmus 12/30/2014  . Breast lump 12/30/2014  . Malignant neoplasm of cervix uteri ([place]) 12/30/2014  . Esophageal reflux 12/30/2014  . Calculus of kidney 12/30/2014  . Obstructive apnea 12/30/2014  . Snores 12/30/2014  . Blood pressure elevated without history of HTN 08/18/2009  . Radial styloid tenosynovitis 08/15/2009  . Allergic rhinitis 09/18/2007  . Carpal tunnel syndrome 09/18/2007  . Cannot sleep 09/18/2007  . Headache, migraine 09/18/2007  . Hypercholesterolemia without hypertriglyceridemia 09/18/2007    Outpatient Encounter Medications as of 05/29/2017  Medication Sig  . dexlansoprazole (DEXILANT) 60 MG capsule Take 1 capsule (60 mg total) by mouth daily.  Marland Kitchen ibuprofen (ADVIL,MOTRIN) 600 MG tablet TAKE 1 TABLET(600 MG) BY MOUTH EVERY 8 HOURS AS NEEDED  . doxycycline (VIBRA-TABS) 100 MG tablet Take 1 tablet (100 mg total) by mouth 2 (two) times daily for 7 days.   No facility-administered encounter medications on file as of 05/29/2017.     Allergies  Allergen Reactions  . Latex     Other reaction(s): Unknown  . Penicillins Hives  . Sulfa Antibiotics Nausea And Vomiting  . Trazodone And Nefazodone Nausea Only and Other (See Comments)    dizziness       Physical Exam  Constitutional: She is oriented to person, place, and time. She appears well-developed and well-nourished.  HENT:  Right Ear: Tympanic membrane and external ear normal.  Left Ear: Tympanic membrane and external ear normal.  Mouth/Throat: Oropharynx is clear and moist. No oropharyngeal exudate.  Eyes: Conjunctivae are normal.  Neck: Neck supple.  Cardiovascular: Normal rate and regular rhythm.  Pulmonary/Chest: Effort normal and breath sounds normal.  Lymphadenopathy:    She has cervical adenopathy.  Neurological: She is alert and oriented to person, place, and time.  Skin: Skin is warm and dry.  Psychiatric: She has a normal mood and affect. Her behavior is normal.        Assessment & Plan:  1. Acute non-recurrent ethmoidal sinusitis  - doxycycline (VIBRA-TABS) 100 MG tablet; Take 1 tablet (100 mg total) by mouth 2 (two) times daily for 7 days.  Dispense: 14 tablet; Refill: 0   Return if symptoms worsen or fail to improve.  The entirety of the information documented in the History of Present Illness, Review of Systems and Physical Exam were personally obtained by me. Portions of this information were initially documented by Ashley Royalty, CMA and reviewed by me for thoroughness and accuracy.

## 2017-05-29 NOTE — Patient Instructions (Addendum)

## 2017-06-25 ENCOUNTER — Other Ambulatory Visit: Payer: Self-pay | Admitting: Gastroenterology

## 2017-06-25 NOTE — Telephone Encounter (Signed)
*  STAT* If patient is at the pharmacy, call can be transferred to refill team.   1. Which medications need to be refilled? (please list name of each medication and dose if known) Dexilant  2. Which pharmacy/location (including street and city if local pharmacy) is medication to be sent to? [facility] in [place]  3. Do they need a 30 day or 90 day supply?   Dexilant is too expensive. She needs a coupon, samples or a different medication. Please patient at work # [phone].

## 2017-06-26 ENCOUNTER — Other Ambulatory Visit: Payer: Self-pay

## 2017-06-26 NOTE — Telephone Encounter (Signed)
Pt stated Dexilant is too expensive and would like samples until her follow up appt. Samples provided and put upfront.

## 2017-06-26 NOTE — Telephone Encounter (Signed)
Patient called again regarding her RX.

## 2017-07-28 ENCOUNTER — Other Ambulatory Visit: Payer: Self-pay | Admitting: Physician Assistant

## 2017-07-28 DIAGNOSIS — Z1231 Encounter for screening mammogram for malignant neoplasm of breast: Secondary | ICD-10-CM

## 2017-07-29 ENCOUNTER — Ambulatory Visit (INDEPENDENT_AMBULATORY_CARE_PROVIDER_SITE_OTHER): Payer: BLUE CROSS/BLUE SHIELD | Admitting: Physician Assistant

## 2017-07-29 ENCOUNTER — Encounter: Payer: Self-pay | Admitting: Physician Assistant

## 2017-07-29 VITALS — BP 110/70 | HR 76 | Temp 98.0°F | Resp 16 | Ht 65.0 in | Wt 181.0 lb

## 2017-07-29 DIAGNOSIS — Z1239 Encounter for other screening for malignant neoplasm of breast: Secondary | ICD-10-CM

## 2017-07-29 DIAGNOSIS — Z23 Encounter for immunization: Secondary | ICD-10-CM | POA: Diagnosis not present

## 2017-07-29 DIAGNOSIS — R030 Elevated blood-pressure reading, without diagnosis of hypertension: Secondary | ICD-10-CM | POA: Diagnosis not present

## 2017-07-29 DIAGNOSIS — R319 Hematuria, unspecified: Secondary | ICD-10-CM | POA: Diagnosis not present

## 2017-07-29 DIAGNOSIS — Z8541 Personal history of malignant neoplasm of cervix uteri: Secondary | ICD-10-CM | POA: Diagnosis not present

## 2017-07-29 DIAGNOSIS — Z1272 Encounter for screening for malignant neoplasm of vagina: Secondary | ICD-10-CM | POA: Diagnosis not present

## 2017-07-29 DIAGNOSIS — Z0000 Encounter for general adult medical examination without abnormal findings: Secondary | ICD-10-CM | POA: Diagnosis not present

## 2017-07-29 DIAGNOSIS — Z1231 Encounter for screening mammogram for malignant neoplasm of breast: Secondary | ICD-10-CM | POA: Diagnosis not present

## 2017-07-29 DIAGNOSIS — Z114 Encounter for screening for human immunodeficiency virus [HIV]: Secondary | ICD-10-CM

## 2017-07-29 DIAGNOSIS — E7800 Pure hypercholesterolemia, unspecified: Secondary | ICD-10-CM

## 2017-07-29 LAB — POCT URINALYSIS DIPSTICK
Bilirubin, UA: NEGATIVE
Glucose, UA: NEGATIVE
Ketones, UA: NEGATIVE
Leukocytes, UA: NEGATIVE
Nitrite, UA: NEGATIVE
Protein, UA: NEGATIVE
Spec Grav, UA: 1.02 (ref 1.010–1.025)
Urobilinogen, UA: 0.2 E.U./dL
pH, UA: 5 (ref 5.0–8.0)

## 2017-07-29 NOTE — Progress Notes (Signed)
Patient: Dawn Summers, Female    DOB: 1966/03/01, 52 y.o.   MRN: [id number] Visit Date: 07/29/2017  Today's Provider: Mar Daring, PA-C   Chief Complaint  Patient presents with  . Annual Exam   Subjective:    Annual physical exam Dawn Summers is a 52 y.o. female who presents today for health maintenance and complete physical. She feels well. She reports exercising 2 days a week. She reports she is sleeping fairly well. 04/17/16 CPE 12/10/13 Pap-neg Hysterectomy in 1995 11/29/16 Mammogram-BI-RADS 1 06/13/14 Colonoscopy-normal ----------------------------------------------------------------- Patient C/O blood in urine on and off times several months.   Review of Systems  Constitutional: Negative.   HENT: Positive for congestion and sneezing.   Eyes: Positive for itching.  Respiratory: Positive for cough.   Cardiovascular: Negative.   Gastrointestinal: Negative.   Endocrine: Positive for polyuria.  Genitourinary: Positive for hematuria and urgency.  Musculoskeletal: Positive for back pain and joint swelling.  Skin: Negative.   Allergic/Immunologic: Negative.   Neurological: Negative.   Hematological: Bruises/bleeds easily.  Psychiatric/Behavioral: Positive for sleep disturbance.    Social History      She  reports that she quit smoking about 12 years ago. she has never used smokeless tobacco. She reports that she drinks alcohol. She reports that she does not use drugs.       Social History   Socioeconomic History  . Marital status: Single    Spouse name: None  . Number of children: 2  . Years of education: None  . Highest education level: None  Social Needs  . Financial resource strain: None  . Food insecurity - worry: None  . Food insecurity - inability: None  . Transportation needs - medical: None  . Transportation needs - non-medical: None  Occupational History    Employer: [employer]  Tobacco Use  . Smoking status: Former Smoker    Last  attempt to quit: 11/22/2004    Years since quitting: 12.6  . Smokeless tobacco: Never Used  Substance and Sexual Activity  . Alcohol use: Yes    Alcohol/week: 0.0 oz  . Drug use: No  . Sexual activity: None  Other Topics Concern  . None  Social History Narrative  . None    Past Medical History:  Diagnosis Date  . Breast mass    left bx done 2016  . Cancer (HCC)    cervical  . Carpal tunnel syndrome   . GERD (gastroesophageal reflux disease)   . Hyperlipidemia   . Hypertension   . Insomnia   . Sleep apnea   . Vitamin D deficiency      Patient Active Problem List   Diagnosis Date Noted  . H/O: pneumonia 04/17/2016  . Degeneration of intervertebral disc of lumbar region 08/07/2015  . Neuritis or radiculitis due to rupture of lumbar intervertebral disc 08/07/2015  . Lumbar radiculitis 08/07/2015  . Back ache 12/30/2014  . Benign paroxysmal positional nystagmus 12/30/2014  . Breast lump 12/30/2014  . History of cervical cancer in adulthood 12/30/2014  . Esophageal reflux 12/30/2014  . Calculus of kidney 12/30/2014  . Obstructive apnea 12/30/2014  . Snores 12/30/2014  . Blood pressure elevated without history of HTN 08/18/2009  . Radial styloid tenosynovitis 08/15/2009  . Allergic rhinitis 09/18/2007  . Carpal tunnel syndrome 09/18/2007  . Cannot sleep 09/18/2007  . Headache, migraine 09/18/2007  . Hypercholesterolemia without hypertriglyceridemia 09/18/2007    Past Surgical History:  Procedure Laterality Date  . Bladder "  Pack"  2010  . BREAST BIOPSY Left 2016   [place] bx benign  . CERVICAL SPINE SURGERY     C-3 and C-4 fusion  . VAGINAL HYSTERECTOMY  1994   cervical cancer, ovaries intact    Family History        Family Status  Relation Name Status  . Mother  Deceased  . Father  Alive  . Sister 6 half-sister Alive  . Brother 5 half-brothers Alive  . Daughter  Alive  . Daughter  Alive  . Neg Hx  (Not Specified)        Her family history includes COPD in  her father; Cancer in her father and sister; Colon polyps in her father; Congestive Heart Failure in her mother; Coronary artery disease in her mother; Diabetes in her mother; Healthy in her daughter and daughter; Hyperlipidemia in her father; Hypertension in her father; Lung disease in her mother; Stroke in her father.     Allergies  Allergen Reactions  . Latex     Other reaction(s): Unknown  . Penicillins Hives  . Sulfa Antibiotics Nausea And Vomiting  . Trazodone And Nefazodone Nausea Only and Other (See Comments)    dizziness     Current Outpatient Medications:  .  dexlansoprazole (DEXILANT) 60 MG capsule, Take 1 capsule (60 mg total) by mouth daily., Disp: 30 capsule, Rfl: 11 .  ibuprofen (ADVIL,MOTRIN) 600 MG tablet, TAKE 1 TABLET(600 MG) BY MOUTH EVERY 8 HOURS AS NEEDED, Disp: 30 tablet, Rfl: 0   Patient Care Team: Mar Daring, PA-C as PCP - General (Family Medicine)      Objective:   Vitals: BP 110/70 (BP Location: Left Arm, Patient Position: Sitting, Cuff Size: Large)   Pulse 76   Temp 98 F (36.7 C) (Oral)   Resp 16   Ht 5\' 5"  (1.651 m)   Wt 181 lb (82.1 kg)   SpO2 98%   BMI 30.12 kg/m    Vitals:   07/29/17 0907  BP: 110/70  Pulse: 76  Resp: 16  Temp: 98 F (36.7 C)  TempSrc: Oral  SpO2: 98%  Weight: 181 lb (82.1 kg)  Height: 5\' 5"  (1.651 m)     Physical Exam  Constitutional: She is oriented to person, place, and time. She appears well-developed and well-nourished. No distress.  HENT:  Head: Normocephalic and atraumatic.  Right Ear: Hearing, tympanic membrane, external ear and ear canal normal.  Left Ear: Hearing, tympanic membrane, external ear and ear canal normal.  Nose: Nose normal.  Mouth/Throat: Uvula is midline, oropharynx is clear and moist and mucous membranes are normal. No oropharyngeal exudate.  Eyes: Conjunctivae and EOM are normal. Pupils are equal, round, and reactive to light. Right eye exhibits no discharge. Left eye exhibits  no discharge. No scleral icterus.  Neck: Normal range of motion. Neck supple. No JVD present. Carotid bruit is not present. No tracheal deviation present. No thyromegaly present.  Cardiovascular: Normal rate, regular rhythm, normal heart sounds and intact distal pulses. Exam reveals no gallop and no friction rub.  No murmur heard. Pulmonary/Chest: Effort normal and breath sounds normal. No respiratory distress. She has no wheezes. She has no rales. She exhibits no tenderness. Right breast exhibits no inverted nipple, no mass, no nipple discharge, no skin change and no tenderness. Left breast exhibits no inverted nipple, no mass, no nipple discharge, no skin change and no tenderness. Breasts are symmetrical.  Abdominal: Soft. Bowel sounds are normal. She exhibits no distension and no mass.  There is no tenderness. There is no rebound and no guarding. Hernia confirmed negative in the right inguinal area and confirmed negative in the left inguinal area.  Genitourinary: Rectum normal and vagina normal. No breast swelling, tenderness, discharge or bleeding. Pelvic exam was performed with patient supine. There is no rash, tenderness, lesion or injury on the right labia. There is no rash, tenderness, lesion or injury on the left labia. Right adnexum displays no mass, no tenderness and no fullness. Left adnexum displays no mass, no tenderness and no fullness. No erythema, tenderness or bleeding in the vagina. No signs of injury around the vagina. No vaginal discharge found.  Genitourinary Comments: Uterus and cervix surgically absent  Musculoskeletal: Normal range of motion. She exhibits no edema or tenderness.  Lymphadenopathy:    She has no cervical adenopathy.       Right: No inguinal adenopathy present.       Left: No inguinal adenopathy present.  Neurological: She is alert and oriented to person, place, and time. She has normal reflexes. No cranial nerve deficit. Coordination normal.  Skin: Skin is warm  and dry. No rash noted. She is not diaphoretic.  Psychiatric: She has a normal mood and affect. Her behavior is normal. Judgment and thought content normal.  Vitals reviewed.    Depression Screen PHQ 2/9 Scores 07/29/2017 04/17/2016  PHQ - 2 Score 0 0  PHQ- 9 Score 5 -      Assessment & Plan:     Routine Health Maintenance and Physical Exam  Exercise Activities and Dietary recommendations Goals    None      Immunization History  Administered Date(s) Administered  . Influenza,inj,Quad PF,6+ Mos 05/24/2014, 04/20/2015, 04/17/2016  . Pneumococcal Polysaccharide-23 05/12/2015  . Tdap 11/24/2012    Health Maintenance  Topic Date Due  . HIV Screening  04/25/1979  . PAP SMEAR  12/10/2016  . INFLUENZA VACCINE  01/22/2017  . MAMMOGRAM  06/13/2018  . TETANUS/TDAP  11/25/2022  . COLONOSCOPY  06/13/2024  . Hepatitis C Screening  Completed     Discussed health benefits of physical activity, and encouraged her to engage in regular exercise appropriate for her age and condition.    1. Annual physical exam Normal physical exam today. Will check labs as below and f/u pending lab results. If labs are stable and WNL she will not need to have these rechecked for one year at her next annual physical exam. She is to call the office in the meantime if she has any acute issue, questions or concerns. - CBC with Differential/Platelet - Comprehensive metabolic panel - Hemoglobin A1c - TSH  2. Screening for breast cancer Already has mammogram scheduled. Breast exam normal today.   3. Screening for vaginal cancer Personal history of cervical cancer. S/P hysterectomy. Pap collected today. Will send as below and f/u pending results. - Pap IG and HPV (high risk) DNA detection  4. History of cervical cancer in adulthood See above medical treatment plan. - Pap IG and HPV (high risk) DNA detection  5. Hypercholesterolemia without hypertriglyceridemia Stable. Diet controlled. Continue  current medical treatment plan. Will check labs as below and f/u pending results. - CBC with Differential/Platelet - Comprehensive metabolic panel - Hemoglobin A1c - Lipid Panel With LDL/HDL Ratio  6. Blood pressure elevated without history of HTN Stable. Diet controlled. Continue current medical treatment plan. Will check labs as below and f/u pending results. - CBC with Differential/Platelet - Comprehensive metabolic panel - Hemoglobin A1c - Lipid Panel With  LDL/HDL Ratio  7. Hematuria, unspecified type New over the last few months. Intermittent. Patient does have history of kidney stones and was a previous smoker (quit in 2006). NH Trace on today's UA. Will send for microscopy as below. Will f/u pending results. If positive or if it continues will refer to urology for further evaluation.   - POCT urinalysis dipstick - Urinalysis, microscopic only  8. Screening for HIV (human immunodeficiency virus) - HIV antibody  9. Need for influenza vaccination Flu vaccine given today without complication. Patient sat upright for 15 minutes to check for adverse reaction before being released. - Flu Vaccine QUAD 36+ mos IM  --------------------------------------------------------------------    Mar Daring, PA-C  [facility]

## 2017-07-29 NOTE — Patient Instructions (Signed)
Health Maintenance for Postmenopausal Women Menopause is a normal process in which your reproductive ability comes to an end. This process happens gradually over a span of months to years, usually between the ages of 22 and 9. Menopause is complete when you have missed 12 consecutive menstrual periods. It is important to talk with your health care provider about some of the most common conditions that affect postmenopausal women, such as heart disease, cancer, and bone loss (osteoporosis). Adopting a healthy lifestyle and getting preventive care can help to promote your health and wellness. Those actions can also lower your chances of developing some of these common conditions. What should I know about menopause? During menopause, you may experience a number of symptoms, such as:  Moderate-to-severe hot flashes.  Night sweats.  Decrease in sex drive.  Mood swings.  Headaches.  Tiredness.  Irritability.  Memory problems.  Insomnia.  Choosing to treat or not to treat menopausal changes is an individual decision that you make with your health care provider. What should I know about hormone replacement therapy and supplements? Hormone therapy products are effective for treating symptoms that are associated with menopause, such as hot flashes and night sweats. Hormone replacement carries certain risks, especially as you become older. If you are thinking about using estrogen or estrogen with progestin treatments, discuss the benefits and risks with your health care provider. What should I know about heart disease and stroke? Heart disease, heart attack, and stroke become more likely as you age. This may be due, in part, to the hormonal changes that your body experiences during menopause. These can affect how your body processes dietary fats, triglycerides, and cholesterol. Heart attack and stroke are both medical emergencies. There are many things that you can do to help prevent heart disease  and stroke:  Have your blood pressure checked at least every 1-2 years. High blood pressure causes heart disease and increases the risk of stroke.  If you are 53-22 years old, ask your health care provider if you should take aspirin to prevent a heart attack or a stroke.  Do not use any tobacco products, including cigarettes, chewing tobacco, or electronic cigarettes. If you need help quitting, ask your health care provider.  It is important to eat a healthy diet and maintain a healthy weight. ? Be sure to include plenty of vegetables, fruits, low-fat dairy products, and lean protein. ? Avoid eating foods that are high in solid fats, added sugars, or salt (sodium).  Get regular exercise. This is one of the most important things that you can do for your health. ? Try to exercise for at least 150 minutes each week. The type of exercise that you do should increase your heart rate and make you sweat. This is known as moderate-intensity exercise. ? Try to do strengthening exercises at least twice each week. Do these in addition to the moderate-intensity exercise.  Know your numbers.Ask your health care provider to check your cholesterol and your blood glucose. Continue to have your blood tested as directed by your health care provider.  What should I know about cancer screening? There are several types of cancer. Take the following steps to reduce your risk and to catch any cancer development as early as possible. Breast Cancer  Practice breast self-awareness. ? This means understanding how your breasts normally appear and feel. ? It also means doing regular breast self-exams. Let your health care provider know about any changes, no matter how small.  If you are 40  or older, have a clinician do a breast exam (clinical breast exam or CBE) every year. Depending on your age, family history, and medical history, it may be recommended that you also have a yearly breast X-ray (mammogram).  If you  have a family history of breast cancer, talk with your health care provider about genetic screening.  If you are at high risk for breast cancer, talk with your health care provider about having an MRI and a mammogram every year.  Breast cancer (BRCA) gene test is recommended for women who have family members with BRCA-related cancers. Results of the assessment will determine the need for genetic counseling and BRCA1 and for BRCA2 testing. BRCA-related cancers include these types: ? Breast. This occurs in males or females. ? Ovarian. ? Tubal. This may also be called fallopian tube cancer. ? Cancer of the abdominal or pelvic lining (peritoneal cancer). ? Prostate. ? Pancreatic.  Cervical, Uterine, and Ovarian Cancer Your health care provider may recommend that you be screened regularly for cancer of the pelvic organs. These include your ovaries, uterus, and vagina. This screening involves a pelvic exam, which includes checking for microscopic changes to the surface of your cervix (Pap test).  For women ages 21-65, health care providers may recommend a pelvic exam and a Pap test every three years. For women ages 79-65, they may recommend the Pap test and pelvic exam, combined with testing for human papilloma virus (HPV), every five years. Some types of HPV increase your risk of cervical cancer. Testing for HPV may also be done on women of any age who have unclear Pap test results.  Other health care providers may not recommend any screening for nonpregnant women who are considered low risk for pelvic cancer and have no symptoms. Ask your health care provider if a screening pelvic exam is right for you.  If you have had past treatment for cervical cancer or a condition that could lead to cancer, you need Pap tests and screening for cancer for at least 20 years after your treatment. If Pap tests have been discontinued for you, your risk factors (such as having a new sexual partner) need to be  reassessed to determine if you should start having screenings again. Some women have medical problems that increase the chance of getting cervical cancer. In these cases, your health care provider may recommend that you have screening and Pap tests more often.  If you have a family history of uterine cancer or ovarian cancer, talk with your health care provider about genetic screening.  If you have vaginal bleeding after reaching menopause, tell your health care provider.  There are currently no reliable tests available to screen for ovarian cancer.  Lung Cancer Lung cancer screening is recommended for adults 69-62 years old who are at high risk for lung cancer because of a history of smoking. A yearly low-dose CT scan of the lungs is recommended if you:  Currently smoke.  Have a history of at least 30 pack-years of smoking and you currently smoke or have quit within the past 15 years. A pack-year is smoking an average of one pack of cigarettes per day for one year.  Yearly screening should:  Continue until it has been 15 years since you quit.  Stop if you develop a health problem that would prevent you from having lung cancer treatment.  Colorectal Cancer  This type of cancer can be detected and can often be prevented.  Routine colorectal cancer screening usually begins at  age 42 and continues through age 45.  If you have risk factors for colon cancer, your health care provider may recommend that you be screened at an earlier age.  If you have a family history of colorectal cancer, talk with your health care provider about genetic screening.  Your health care provider may also recommend using home test kits to check for hidden blood in your stool.  A small camera at the end of a tube can be used to examine your colon directly (sigmoidoscopy or colonoscopy). This is done to check for the earliest forms of colorectal cancer.  Direct examination of the colon should be repeated every  5-10 years until age 71. However, if early forms of precancerous polyps or small growths are found or if you have a family history or genetic risk for colorectal cancer, you may need to be screened more often.  Skin Cancer  Check your skin from head to toe regularly.  Monitor any moles. Be sure to tell your health care provider: ? About any new moles or changes in moles, especially if there is a change in a mole's shape or color. ? If you have a mole that is larger than the size of a pencil eraser.  If any of your family members has a history of skin cancer, especially at a young age, talk with your health care provider about genetic screening.  Always use sunscreen. Apply sunscreen liberally and repeatedly throughout the day.  Whenever you are outside, protect yourself by wearing long sleeves, pants, a wide-brimmed hat, and sunglasses.  What should I know about osteoporosis? Osteoporosis is a condition in which bone destruction happens more quickly than new bone creation. After menopause, you may be at an increased risk for osteoporosis. To help prevent osteoporosis or the bone fractures that can happen because of osteoporosis, the following is recommended:  If you are 46-71 years old, get at least 1,000 mg of calcium and at least 600 mg of vitamin D per day.  If you are older than age 55 but younger than age 65, get at least 1,200 mg of calcium and at least 600 mg of vitamin D per day.  If you are older than age 54, get at least 1,200 mg of calcium and at least 800 mg of vitamin D per day.  Smoking and excessive alcohol intake increase the risk of osteoporosis. Eat foods that are rich in calcium and vitamin D, and do weight-bearing exercises several times each week as directed by your health care provider. What should I know about how menopause affects my mental health? Depression may occur at any age, but it is more common as you become older. Common symptoms of depression  include:  Low or sad mood.  Changes in sleep patterns.  Changes in appetite or eating patterns.  Feeling an overall lack of motivation or enjoyment of activities that you previously enjoyed.  Frequent crying spells.  Talk with your health care provider if you think that you are experiencing depression. What should I know about immunizations? It is important that you get and maintain your immunizations. These include:  Tetanus, diphtheria, and pertussis (Tdap) booster vaccine.  Influenza every year before the flu season begins.  Pneumonia vaccine.  Shingles vaccine.  Your health care provider may also recommend other immunizations. This information is not intended to replace advice given to you by your health care provider. Make sure you discuss any questions you have with your health care provider. Document Released: 08/02/2005  Document Revised: 12/29/2015 Document Reviewed: 03/14/2015 Elsevier Interactive Patient Education  2018 [employer]  

## 2017-07-30 ENCOUNTER — Telehealth: Payer: Self-pay

## 2017-07-30 LAB — CBC WITH DIFFERENTIAL/PLATELET
Basophils Absolute: 0.1 10*3/uL (ref 0.0–0.2)
Basos: 1 %
EOS (ABSOLUTE): 0.1 10*3/uL (ref 0.0–0.4)
Eos: 2 %
Hematocrit: 45.3 % (ref 34.0–46.6)
Hemoglobin: 15.6 g/dL (ref 11.1–15.9)
Immature Grans (Abs): 0 10*3/uL (ref 0.0–0.1)
Immature Granulocytes: 1 %
Lymphocytes Absolute: 1.9 10*3/uL (ref 0.7–3.1)
Lymphs: 29 %
MCH: 29.7 pg (ref 26.6–33.0)
MCHC: 34.4 g/dL (ref 31.5–35.7)
MCV: 86 fL (ref 79–97)
Monocytes Absolute: 0.4 10*3/uL (ref 0.1–0.9)
Monocytes: 6 %
Neutrophils Absolute: 4 10*3/uL (ref 1.4–7.0)
Neutrophils: 61 %
Platelets: 294 10*3/uL (ref 150–379)
RBC: 5.26 x10E6/uL (ref 3.77–5.28)
RDW: 13.9 % (ref 12.3–15.4)
WBC: 6.4 10*3/uL (ref 3.4–10.8)

## 2017-07-30 LAB — LIPID PANEL WITH LDL/HDL RATIO
Cholesterol, Total: 217 mg/dL — ABNORMAL HIGH (ref 100–199)
HDL: 73 mg/dL (ref >39)
LDL Calculated: 134 mg/dL — ABNORMAL HIGH (ref 0–99)
LDl/HDL Ratio: 1.8 ratio (ref 0.0–3.2)
Triglycerides: 52 mg/dL (ref 0–149)
VLDL Cholesterol Cal: 10 mg/dL (ref 5–40)

## 2017-07-30 LAB — COMPREHENSIVE METABOLIC PANEL
ALT: 24 IU/L (ref 0–32)
AST: 16 IU/L (ref 0–40)
Albumin/Globulin Ratio: 1.7 (ref 1.2–2.2)
Albumin: 4.4 g/dL (ref 3.5–5.5)
Alkaline Phosphatase: 85 IU/L (ref 39–117)
BUN/Creatinine Ratio: 16 (ref 9–23)
BUN: 13 mg/dL (ref 6–24)
Bilirubin Total: 0.3 mg/dL (ref 0.0–1.2)
CO2: 24 mmol/L (ref 20–29)
Calcium: 9.5 mg/dL (ref 8.7–10.2)
Chloride: 103 mmol/L (ref 96–106)
Creatinine, Ser: 0.8 mg/dL (ref 0.57–1.00)
GFR calc Af Amer: 97 mL/min/{1.73_m2} (ref >59)
GFR calc non Af Amer: 84 mL/min/{1.73_m2} (ref >59)
Globulin, Total: 2.6 g/dL (ref 1.5–4.5)
Glucose: 96 mg/dL (ref 65–99)
Potassium: 4.7 mmol/L (ref 3.5–5.2)
Sodium: 142 mmol/L (ref 134–144)
Total Protein: 7 g/dL (ref 6.0–8.5)

## 2017-07-30 LAB — TSH: TSH: 1.31 u[IU]/mL (ref 0.450–4.500)

## 2017-07-30 LAB — URINALYSIS, MICROSCOPIC ONLY: Casts: NONE SEEN /lpf

## 2017-07-30 LAB — HEMOGLOBIN A1C
Est. average glucose Bld gHb Est-mCnc: 117 mg/dL
Hgb A1c MFr Bld: 5.7 % — ABNORMAL HIGH (ref 4.8–5.6)

## 2017-07-30 LAB — HIV ANTIBODY (ROUTINE TESTING W REFLEX): HIV Screen 4th Generation wRfx: NONREACTIVE

## 2017-07-30 NOTE — Telephone Encounter (Signed)
Patient called back. She reports that it is ok to leave results on VM. Thanks!

## 2017-07-30 NOTE — Telephone Encounter (Signed)
LMTCB  Thanks,  -Remy Dia 

## 2017-07-30 NOTE — Telephone Encounter (Signed)
-----   Message from Mar Daring, [place] sent at 07/30/2017  8:13 AM EST ----- Urine micro does not show any RBC just like last time it was checked (April 2018). If patient desires I can still refer to Urology to make sure nothing else going on for the intermittent hematuria. All other labs are normal. Cholesterol slightly improved from last year. A1c remains stable being just borderline elevated at 5.7. Continue healthy lifestyle modifications.

## 2017-07-30 NOTE — Telephone Encounter (Signed)
Pt advised.  She will hold off on the referral for now.   Thanks,   -Mickel Baas

## 2017-07-31 ENCOUNTER — Telehealth: Payer: Self-pay

## 2017-07-31 LAB — PAP IG AND HPV HIGH-RISK
HPV, high-risk: NEGATIVE
PAP Smear Comment: 0

## 2017-07-31 NOTE — Telephone Encounter (Signed)
Patient advised as below.  

## 2017-07-31 NOTE — Telephone Encounter (Signed)
-----   Message from Mar Daring, PA-C sent at 07/31/2017 11:09 AM EST ----- Pap is normal, HPV negative.  Will repeat in 3-5 years.

## 2017-08-05 ENCOUNTER — Encounter: Payer: Self-pay | Admitting: Gastroenterology

## 2017-08-05 ENCOUNTER — Ambulatory Visit (INDEPENDENT_AMBULATORY_CARE_PROVIDER_SITE_OTHER): Payer: BLUE CROSS/BLUE SHIELD | Admitting: Gastroenterology

## 2017-08-05 VITALS — BP 127/77 | HR 72 | Ht 65.0 in | Wt 182.8 lb

## 2017-08-05 DIAGNOSIS — K219 Gastro-esophageal reflux disease without esophagitis: Secondary | ICD-10-CM | POA: Diagnosis not present

## 2017-08-05 MED ORDER — PANTOPRAZOLE SODIUM 40 MG PO TBEC: 40.0000 mg | DELAYED_RELEASE_TABLET | Freq: Every day | ORAL | 6 refills | Status: DC

## 2017-08-05 NOTE — Progress Notes (Signed)
Primary Care Physician: Mar Daring, PA-C  Primary Gastroenterologist:  Dr. Lucilla Lame  Chief Complaint  Patient presents with  . Medication Refill    HPI: Dawn Summers is a 52 y.o. female here for follow-up of her heartburn.  The patient has been well controlled on Dexilant but states that the medication is very expensive.  The patient would like to try some other medication.  The patient denies any black stools or bloody stools.  She also denies any nausea vomiting fevers or chills. There is no report of any dysphagia except some time she states after she eats she feels like there is a fullness in her throat but she never has to vomit the food back up nor does it inhibit her from further eating or from breathing.  Current Outpatient Medications  Medication Sig Dispense Refill  . dexlansoprazole (DEXILANT) 60 MG capsule Take 1 capsule (60 mg total) by mouth daily. 30 capsule 11  . ibuprofen (ADVIL,MOTRIN) 600 MG tablet TAKE 1 TABLET(600 MG) BY MOUTH EVERY 8 HOURS AS NEEDED (Patient not taking: Reported on 08/05/2017) 30 tablet 0  . pantoprazole (PROTONIX) 40 MG tablet Take 1 tablet (40 mg total) by mouth daily. 30 tablet 6   No current facility-administered medications for this visit.     Allergies as of 08/05/2017 - Review Complete 07/29/2017  Allergen Reaction Noted  . Latex  07/05/2015  . Penicillins Hives 12/19/2014  . Sulfa antibiotics Nausea And Vomiting 12/19/2014  . Trazodone and nefazodone Nausea Only and Other (See Comments) 12/19/2014    ROS:  General: Negative for anorexia, weight loss, fever, chills, fatigue, weakness. ENT: Negative for hoarseness, difficulty swallowing , nasal congestion. CV: Negative for chest pain, angina, palpitations, dyspnea on exertion, peripheral edema.  Respiratory: Negative for dyspnea at rest, dyspnea on exertion, cough, sputum, wheezing.  GI: See history of present illness. GU:  Negative for dysuria, hematuria, urinary  incontinence, urinary frequency, nocturnal urination.  Endo: Negative for unusual weight change.    Physical Examination:   BP 127/77   Pulse 72   Ht 5\' 5"  (1.651 m)   Wt 182 lb 12.8 oz (82.9 kg)   BMI 30.42 kg/m   General: Well-nourished, well-developed in no acute distress.  Eyes: No icterus. Conjunctivae pink. Mouth: Oropharyngeal mucosa moist and pink , no lesions erythema or exudate. Lungs: Clear to auscultation bilaterally. Non-labored. Heart: Regular rate and rhythm, no murmurs rubs or gallops.  Abdomen: Bowel sounds are normal, nontender, nondistended, no hepatosplenomegaly or masses, no abdominal bruits or hernia , no rebound or guarding.   Extremities: No lower extremity edema. No clubbing or deformities. Neuro: Alert and oriented x 3.  Grossly intact. Skin: Warm and dry, no jaundice.   Psych: Alert and cooperative, normal mood and affect.  Labs:    Imaging Studies: No results found.  Assessment and Plan:   Dawn Summers is a 52 y.o. y/o female who comes in with a history of heartburn and is in need of a refill of her medication.  The patient reports that the Dexilant is expensive and would like to Dr. Something cheaper.  The patient will be started on a trial of Protonix.  The patient has also been told that if her dysphagia should progress she should contact us immediately. The patient has been explained the plan and agrees with it    Lucilla Lame, MD. Marval Regal   Note: This dictation was prepared with Dragon dictation along with smaller phrase technology. Any  transcriptional errors that result from this process are unintentional.

## 2017-08-11 ENCOUNTER — Ambulatory Visit
Admission: RE | Admit: 2017-08-11 | Discharge: 2017-08-11 | Disposition: A | Discharge status: Home / Self Care | Payer: BLUE CROSS/BLUE SHIELD | Source: Ambulatory Visit | Attending: Physician Assistant | Admitting: Physician Assistant

## 2017-08-11 DIAGNOSIS — Z1231 Encounter for screening mammogram for malignant neoplasm of breast: Secondary | ICD-10-CM | POA: Diagnosis not present

## 2017-08-12 ENCOUNTER — Telehealth: Payer: Self-pay

## 2017-08-12 NOTE — Telephone Encounter (Signed)
lmtcb

## 2017-08-12 NOTE — Telephone Encounter (Signed)
-----   Message from Mar Daring, PA-C sent at 08/12/2017  8:21 AM EST ----- Normal mammogram. Repeat screening in one year.

## 2017-08-12 NOTE — Telephone Encounter (Signed)
Patient advised.cbe °

## 2017-08-26 ENCOUNTER — Emergency Department
Admission: EM | Admit: 2017-08-26 | Discharge: 2017-08-26 | Disposition: A | Discharge status: Home / Self Care | Payer: BLUE CROSS/BLUE SHIELD | Attending: Emergency Medicine | Admitting: Emergency Medicine

## 2017-08-26 ENCOUNTER — Other Ambulatory Visit: Payer: Self-pay

## 2017-08-26 ENCOUNTER — Emergency Department: Payer: BLUE CROSS/BLUE SHIELD

## 2017-08-26 DIAGNOSIS — Z87891 Personal history of nicotine dependence: Secondary | ICD-10-CM | POA: Diagnosis not present

## 2017-08-26 DIAGNOSIS — Z79899 Other long term (current) drug therapy: Secondary | ICD-10-CM | POA: Diagnosis not present

## 2017-08-26 DIAGNOSIS — R1032 Left lower quadrant pain: Secondary | ICD-10-CM | POA: Diagnosis present

## 2017-08-26 DIAGNOSIS — Z8541 Personal history of malignant neoplasm of cervix uteri: Secondary | ICD-10-CM | POA: Diagnosis not present

## 2017-08-26 DIAGNOSIS — Z91040 Latex allergy status: Secondary | ICD-10-CM | POA: Diagnosis not present

## 2017-08-26 DIAGNOSIS — N200 Calculus of kidney: Secondary | ICD-10-CM | POA: Diagnosis not present

## 2017-08-26 LAB — COMPREHENSIVE METABOLIC PANEL
ALT: 24 U/L (ref 14–54)
AST: 25 U/L (ref 15–41)
Albumin: 4.3 g/dL (ref 3.5–5.0)
Alkaline Phosphatase: 76 U/L (ref 38–126)
Anion gap: 10 (ref 5–15)
BUN: 16 mg/dL (ref 6–20)
CO2: 24 mmol/L (ref 22–32)
Calcium: 8.7 mg/dL — ABNORMAL LOW (ref 8.9–10.3)
Chloride: 103 mmol/L (ref 101–111)
Creatinine, Ser: 0.72 mg/dL (ref 0.44–1.00)
GFR calc Af Amer: >60 mL/min (ref >60)
GFR calc non Af Amer: >60 mL/min (ref >60)
Glucose, Bld: 99 mg/dL (ref 65–99)
Potassium: 4.3 mmol/L (ref 3.5–5.1)
Sodium: 137 mmol/L (ref 135–145)
Total Bilirubin: 0.8 mg/dL (ref 0.3–1.2)
Total Protein: 7.6 g/dL (ref 6.5–8.1)

## 2017-08-26 LAB — URINALYSIS, COMPLETE (UACMP) WITH MICROSCOPIC
Bacteria, UA: NONE SEEN
Bilirubin Urine: NEGATIVE
Glucose, UA: NEGATIVE mg/dL
Ketones, ur: NEGATIVE mg/dL
Leukocytes, UA: NEGATIVE
Nitrite: NEGATIVE
Protein, ur: NEGATIVE mg/dL
Specific Gravity, Urine: 1.021 (ref 1.005–1.030)
pH: 5 (ref 5.0–8.0)

## 2017-08-26 LAB — CBC
HCT: 49.3 % — ABNORMAL HIGH (ref 35.0–47.0)
Hemoglobin: 16.5 g/dL — ABNORMAL HIGH (ref 12.0–16.0)
MCH: 30 pg (ref 26.0–34.0)
MCHC: 33.6 g/dL (ref 32.0–36.0)
MCV: 89.3 fL (ref 80.0–100.0)
Platelets: 261 10*3/uL (ref 150–440)
RBC: 5.52 MIL/uL — ABNORMAL HIGH (ref 3.80–5.20)
RDW: 14 % (ref 11.5–14.5)
WBC: 10.5 10*3/uL (ref 3.6–11.0)

## 2017-08-26 LAB — LIPASE, BLOOD: Lipase: 30 U/L (ref 11–51)

## 2017-08-26 MED ORDER — ONDANSETRON HCL 4 MG/2ML IJ SOLN
4.0000 mg | Freq: Once | INTRAMUSCULAR | Status: AC
  Administered 2017-08-26: 4 mg via INTRAVENOUS
  Filled 2017-08-26: qty 2

## 2017-08-26 MED ORDER — SODIUM CHLORIDE 0.9 % IV BOLUS (SEPSIS)
500.0000 mL | Freq: Once | INTRAVENOUS | Status: AC
  Administered 2017-08-26: 500 mL via INTRAVENOUS

## 2017-08-26 MED ORDER — KETOROLAC TROMETHAMINE 30 MG/ML IJ SOLN
30.0000 mg | Freq: Once | INTRAMUSCULAR | Status: AC
  Administered 2017-08-26: 30 mg via INTRAVENOUS
  Filled 2017-08-26: qty 1

## 2017-08-26 MED ORDER — TAMSULOSIN HCL 0.4 MG PO CAPS: 0.4000 mg | ORAL_CAPSULE | Freq: Every day | ORAL | 0 refills | Status: DC

## 2017-08-26 MED ORDER — OXYCODONE-ACETAMINOPHEN 5-325 MG PO TABS: 1.0000 | ORAL_TABLET | ORAL | 0 refills | Status: DC | PRN

## 2017-08-26 MED ORDER — ONDANSETRON HCL 4 MG PO TABS: 4.0000 mg | ORAL_TABLET | Freq: Three times a day (TID) | ORAL | 0 refills | Status: DC | PRN

## 2017-08-26 MED ORDER — IBUPROFEN 200 MG PO TABS: 600.0000 mg | ORAL_TABLET | Freq: Four times a day (QID) | ORAL | 0 refills | Status: DC | PRN

## 2017-08-26 NOTE — Discharge Instructions (Signed)
We also notice that you have an abnormal appearance to the uterine cuff after C-section.  This does not appear to be unchanged from prior imaging however, it is the kind of thing your family doctor should be aware of ; please let them know and have them get the CT reads for further evaluation.  If you have high fever, persistent or worsening pain, persistent vomiting or you feel worse in any way return to the emergency room.

## 2017-08-26 NOTE — ED Triage Notes (Signed)
To ER via POV c/o right sided abdominal pain upon awakening today. Emesis and diarrhea. Pt alert and oriented X4, active, cooperative, pt in NAD. RR even and unlabored, color WNL.

## 2017-08-26 NOTE — ED Provider Notes (Addendum)
[facility] Emergency Department Provider Note  ____________________________________________   I have reviewed the triage vital signs and the nursing notes. Where available I have reviewed prior notes and, if possible and indicated, outside hospital notes.    HISTORY  Chief Complaint Abdominal Pain and Emesis    HPI Dawn Summers is a 52 y.o. female remote history of kidney stones, had cervical cancer which was taken care of by C-section without radiation or chemotherapy, history of sleep apnea, insomnia, carpal tunnel syndrome, with chronic back pain, migraine headaches etc. presents today complaining of left-sided flank pain which began suddenly, this morning, possibly consistent with kidney stone.  No antecedent dysuria urinary frequency or UTI symptoms.  Pain is sharp, radiates towards her groin.  She has had vomiting, "every time I drink something today".  She also states that she has had some loose stools today as well.  She denies fever or chills.  She was fine until this morning.  She is tried nothing for this at home.  Pain is significant when it comes it seems to wax and wane.  She also states she has had pain nearly every day for a year also in her abdomen and knowing can tell her why.  Feels similar to that pain but more intense.    Past Medical History:  Diagnosis Date  . Cancer (HCC)    cervical  . Carpal tunnel syndrome   . GERD (gastroesophageal reflux disease)   . Hyperlipidemia   . Insomnia   . Sleep apnea   . Vitamin D deficiency     Patient Active Problem List   Diagnosis Date Noted  . H/O: pneumonia 04/17/2016  . Degeneration of intervertebral disc of lumbar region 08/07/2015  . Neuritis or radiculitis due to rupture of lumbar intervertebral disc 08/07/2015  . Lumbar radiculitis 08/07/2015  . Back ache 12/30/2014  . Benign paroxysmal positional nystagmus 12/30/2014  . Breast lump 12/30/2014  . History of cervical cancer in adulthood  12/30/2014  . Esophageal reflux 12/30/2014  . Calculus of kidney 12/30/2014  . Obstructive apnea 12/30/2014  . Snores 12/30/2014  . Blood pressure elevated without history of HTN 08/18/2009  . Radial styloid tenosynovitis 08/15/2009  . Allergic rhinitis 09/18/2007  . Carpal tunnel syndrome 09/18/2007  . Cannot sleep 09/18/2007  . Headache, migraine 09/18/2007  . Hypercholesterolemia without hypertriglyceridemia 09/18/2007    Past Surgical History:  Procedure Laterality Date  . Bladder "Pack"  2010  . BREAST BIOPSY Left 2016   [place] bx benign  . CERVICAL SPINE SURGERY     C-3 and C-4 fusion  . VAGINAL HYSTERECTOMY  1994   cervical cancer, ovaries intact    Prior to Admission medications   Medication Sig Start Date End Date Taking? Authorizing Provider  dexlansoprazole (DEXILANT) 60 MG capsule Take 1 capsule (60 mg total) by mouth daily. 06/13/16   Lucilla Lame, MD  ibuprofen (ADVIL,MOTRIN) 600 MG tablet TAKE 1 TABLET(600 MG) BY MOUTH EVERY 8 HOURS AS NEEDED Patient not taking: Reported on 08/05/2017 03/06/17   Mar Daring, PA-C  pantoprazole (PROTONIX) 40 MG tablet Take 1 tablet (40 mg total) by mouth daily. 08/05/17   Lucilla Lame, MD    Allergies Latex; Penicillins; Sulfa antibiotics; and Trazodone and nefazodone  Family History  Problem Relation Age of Onset  . Diabetes Mother   . Lung disease Mother        chronic  . Congestive Heart Failure Mother   . Coronary artery disease Mother   .  Stroke Father   . Cancer Father        prostate cancer  . Hyperlipidemia Father   . Hypertension Father   . Colon polyps Father   . COPD Father   . Cancer Sister        lung cancer  . Healthy Daughter   . Healthy Daughter   . Breast cancer Neg Hx     Social History Social History   Tobacco Use  . Smoking status: Former Smoker    Last attempt to quit: 11/22/2004    Years since quitting: 12.7  . Smokeless tobacco: Never Used  Substance Use Topics  . Alcohol use: Yes     Alcohol/week: 0.0 oz  . Drug use: No    Review of Systems Constitutional: No fever/chills Eyes: No visual changes. ENT: No sore throat. No stiff neck no neck pain Cardiovascular: Denies chest pain. Respiratory: Denies shortness of breath. Gastrointestinal: See HPI Genitourinary: Negative for dysuria. Musculoskeletal: Negative lower extremity swelling Skin: Negative for rash. Neurological: Negative for severe headaches, focal weakness or numbness.   ____________________________________________   PHYSICAL EXAM:  VITAL SIGNS: ED Triage Vitals [08/26/17 1610]  Enc Vitals Group     BP (!) 138/109     Pulse Rate (!) 105     Resp 18     Temp 98 F (36.7 C)     Temp Source Oral     SpO2 97 %     Weight 182 lb (82.6 kg)     Height 5\' 5"  (1.651 m)     Head Circumference      Peak Flow      Pain Score 9     Pain Loc      Pain Edu?      Excl. in [place]?     Constitutional: Alert and oriented. Well appearing and in no acute distress. Eyes: Conjunctivae are normal Head: Atraumatic HEENT: No congestion/rhinnorhea. Mucous membranes are moist.  Oropharynx non-erythematous Neck:   Nontender with no meningismus, no masses, no stridor Cardiovascular: Normal rate, regular rhythm. Grossly normal heart sounds.  Good peripheral circulation. Respiratory: Normal respiratory effort.  No retractions. Lungs CTAB. Abdominal: Soft and nontender. No distention. No guarding no rebound Back:  There is no focal tenderness or step off.  there is no midline tenderness there are no lesions noted. there is mild left CVA tenderness  Musculoskeletal: No lower extremity tenderness, no upper extremity tenderness. No joint effusions, no DVT signs strong distal pulses no edema Neurologic:  Normal speech and language. No gross focal neurologic deficits are appreciated.  Skin:  Skin is warm, dry and intact. No rash noted. Psychiatric: Mood and affect are normal. Speech and behavior are  normal.   ____________________________________________   LABS (all labs ordered are listed, but only abnormal results are displayed)  Labs Reviewed  COMPREHENSIVE METABOLIC PANEL - Abnormal; Notable for the following components:      Result Value   Calcium 8.7 (*)    All other components within normal limits  CBC - Abnormal; Notable for the following components:   RBC 5.52 (*)    Hemoglobin 16.5 (*)    HCT 49.3 (*)    All other components within normal limits  URINALYSIS, COMPLETE (UACMP) WITH MICROSCOPIC - Abnormal; Notable for the following components:   Color, Urine YELLOW (*)    APPearance CLEAR (*)    Hgb urine dipstick SMALL (*)    Squamous Epithelial / LPF 0-5 (*)    All other components  within normal limits  LIPASE, BLOOD    Pertinent labs  results that were available during my care of the patient were reviewed by me and considered in my medical decision making (see chart for details). ____________________________________________  EKG  I personally interpreted any EKGs ordered by me or triage  ____________________________________________  RADIOLOGY  Pertinent labs & imaging results that were available during my care of the patient were reviewed by me and considered in my medical decision making (see chart for details). If possible, patient and/or family made aware of any abnormal findings.   I personally looked at the CT scan.   No results found. ____________________________________________    PROCEDURES  Procedure(s) performed: None  Procedures  Critical Care performed: None  ____________________________________________   INITIAL IMPRESSION / ASSESSMENT AND PLAN / ED COURSE  Pertinent labs & imaging results that were available during my care of the patient were reviewed by me and considered in my medical decision making (see chart for details).  Patient here with sudden onset left flank pain radiating to the groin does have a history of kidney  stones his faint blood in the urine, most likely this is a kidney stone, however, patient also states she has been having pain similar to this for 1 year, she is a person with a history of cancer I will obtain a CT scan to make sure nothing else is going on.  Very reassuring abdominal exam.  And no evidence of dehydration.  We will give her pain medications IV fluids and antiemetics as well however.  ----------------------------------------- 5:53 PM on 08/26/2017 -----------------------------------------  Feels much better, discussed with Dr. Bernardo Heater of urology who agrees with discharge and follow-up.  Patient does have a abnormal looking cuff she has had this on prior CT scans, I did make her aware of this and did follow closely with her family medicine doctor.  Patient knows she must not work or drive or drink on Percocet.    ____________________________________________   FINAL CLINICAL IMPRESSION(S) / ED DIAGNOSES  Final diagnoses:  None      This chart was dictated using voice recognition software.  Despite best efforts to proofread,  errors can occur which can change meaning.      Schuyler Amor, MD 08/26/17 1708    Schuyler Amor, MD 08/26/17 1754    Schuyler Amor, MD 08/26/17 Carollee Massed

## 2017-08-27 ENCOUNTER — Telehealth: Payer: Self-pay

## 2017-08-27 DIAGNOSIS — Z8541 Personal history of malignant neoplasm of cervix uteri: Secondary | ICD-10-CM

## 2017-08-27 NOTE — Telephone Encounter (Signed)
Patient advised as directed below. Per patient She wants to know what else is there to do for the mass to see if is not cancerous?  Thanks,  -Goebel Hellums

## 2017-08-27 NOTE — Telephone Encounter (Signed)
Pt had imaging done in the ER last night. Per pt, the hospitalists are requesting for PCP to review the imaging due to abnormality. Please review.

## 2017-08-27 NOTE — Telephone Encounter (Signed)
She should f/u with her GYN for further evaluation if concerned.

## 2017-08-27 NOTE — Telephone Encounter (Signed)
47mm bladder stone noted.  2 small umbilical hernias noted. Would need eval to see if having pains in from this.   Stable 52mm mass noted in cervical cuff but is stable and unchanged from previous CT from 2015.  No other abnormalities noted.

## 2017-08-28 ENCOUNTER — Encounter: Payer: Self-pay | Admitting: Urology

## 2017-08-28 ENCOUNTER — Ambulatory Visit
Admission: RE | Admit: 2017-08-28 | Discharge: 2017-08-28 | Disposition: A | Discharge status: Home / Self Care | Payer: BLUE CROSS/BLUE SHIELD | Source: Ambulatory Visit | Attending: Urology | Admitting: Urology

## 2017-08-28 ENCOUNTER — Ambulatory Visit (INDEPENDENT_AMBULATORY_CARE_PROVIDER_SITE_OTHER): Payer: BLUE CROSS/BLUE SHIELD | Admitting: Urology

## 2017-08-28 VITALS — BP 116/78 | HR 78 | Resp 16 | Ht 65.0 in | Wt 180.0 lb

## 2017-08-28 DIAGNOSIS — N210 Calculus in bladder: Secondary | ICD-10-CM | POA: Insufficient documentation

## 2017-08-28 NOTE — Telephone Encounter (Signed)
Pt returned call

## 2017-08-28 NOTE — Telephone Encounter (Signed)
Referral placed.

## 2017-08-28 NOTE — Progress Notes (Signed)
08/28/2017 12:06 PM   Dawn Summers [birth year] [id number]  Referring provider: Mar Daring, PA-C [place], [place] [address]  Chief complaint: Was seen in ER on Tuesday and they found kidney stone  HPI:  Dawn Summers is a 52 year old female who presented to the [facility] ED on 08/26/2017 with acute onset of right flank pain radiating to the right lower quadrant which had started approximately 8 hours prior to her presentation.  The pain was described as severe and intermittent.  She had nausea and vomiting.  She denied fever, chills or gross hematuria.  She has a prior history of stones and states her pain was similar to previous stone episodes.  The ER notes state her pain was on the left however today she states it was on the right side.  She does have chronic back pain.  She has no voiding symptoms.  A stone protocol CT of the abdomen and pelvis was performed which showed no nephrolithiasis or ureteral calculi.  There was a 5 mm calcification present in the bladder.  After her visit her pain resolved however she has had recurrent pain today.  She has had 3 previous stone episodes and has not required any surgical intervention.  She is on tamsulosin.  She has not required any narcotic pain medication.   PMH: Past Medical History:  Diagnosis Date  . Cancer (HCC)    cervical  . Carpal tunnel syndrome   . GERD (gastroesophageal reflux disease)   . Hyperlipidemia   . Insomnia   . Sleep apnea   . Vitamin D deficiency     Surgical History: Past Surgical History:  Procedure Laterality Date  . Bladder "Pack"  2010  . BREAST BIOPSY Left 2016   [place] bx benign  . CERVICAL SPINE SURGERY     C-3 and C-4 fusion  . VAGINAL HYSTERECTOMY  1994   cervical cancer, ovaries intact    Home Medications:  Allergies as of 08/28/2017      Reactions   Latex    Other reaction(s): Unknown   Penicillins Hives   Sulfa Antibiotics Nausea And Vomiting   Trazodone  And Nefazodone Nausea Only, Other (See Comments)   dizziness      Medication List        Accurate as of 08/28/17 12:06 PM. Always use your most recent med list.          ibuprofen 200 MG tablet Commonly known as:  MOTRIN IB Take 3 tablets (600 mg total) by mouth every 6 (six) hours as needed.   ondansetron 4 MG tablet Commonly known as:  ZOFRAN Take 1 tablet (4 mg total) by mouth every 8 (eight) hours as needed for nausea or vomiting.   oxyCODONE-acetaminophen 5-325 MG tablet Commonly known as:  PERCOCET Take 1 tablet by mouth every 4 (four) hours as needed for severe pain.   pantoprazole 40 MG tablet Commonly known as:  PROTONIX Take 1 tablet (40 mg total) by mouth daily.   tamsulosin 0.4 MG Caps capsule Commonly known as:  FLOMAX Take 1 capsule (0.4 mg total) by mouth daily.       Allergies:  Allergies  Allergen Reactions  . Latex     Other reaction(s): Unknown  . Penicillins Hives  . Sulfa Antibiotics Nausea And Vomiting  . Trazodone And Nefazodone Nausea Only and Other (See Comments)    dizziness    Family History: Family History  Problem Relation Age of Onset  .  Diabetes Mother   . Lung disease Mother        chronic  . Congestive Heart Failure Mother   . Coronary artery disease Mother   . Stroke Father   . Cancer Father        prostate cancer  . Hyperlipidemia Father   . Hypertension Father   . Colon polyps Father   . COPD Father   . Cancer Sister        lung cancer  . Healthy Daughter   . Healthy Daughter   . Breast cancer Neg Hx     Social History:  reports that she quit smoking about 12 years ago. she has never used smokeless tobacco. She reports that she drinks alcohol. She reports that she does not use drugs.  ROS: UROLOGY Frequent Urination?: Yes Hard to postpone urination?: Yes Burning/pain with urination?: No Get up at night to urinate?: Yes Leakage of urine?: Yes Urine stream starts and stops?: No Trouble starting stream?:  No Do you have to strain to urinate?: No Blood in urine?: Yes Urinary tract infection?: No Sexually transmitted disease?: No Injury to kidneys or bladder?: No Painful intercourse?: Yes Weak stream?: No Currently pregnant?: No Vaginal bleeding?: No Last menstrual period?: 1995  Gastrointestinal Nausea?: Yes Vomiting?: Yes Indigestion/heartburn?: Yes Diarrhea?: No Constipation?: No  Constitutional Fever: No Night sweats?: No Weight loss?: No Fatigue?: Yes  Skin Skin rash/lesions?: No Itching?: No  Eyes Blurred vision?: No Double vision?: No  Ears/Nose/Throat Sore throat?: No Sinus problems?: Yes  Hematologic/Lymphatic Swollen glands?: No Easy bruising?: Yes  Cardiovascular Leg swelling?: No Chest pain?: No  Respiratory Cough?: No Shortness of breath?: No  Endocrine Excessive thirst?: No  Musculoskeletal Back pain?: Yes Joint pain?: No  Neurological Headaches?: No Dizziness?: No  Psychologic Depression?: No Anxiety?: Yes  Physical Exam: BP 116/78   Pulse 78   Resp 16   Ht 5\' 5"  (1.651 m)   Wt 180 lb (81.6 kg)   SpO2 98%   BMI 29.95 kg/m   Constitutional:  Alert and oriented, No acute distress. HEENT: [place] AT, moist mucus membranes.  Trachea midline, no masses. Cardiovascular: No clubbing, cyanosis, or edema. Respiratory: Normal respiratory effort, no increased work of breathing. GI: Abdomen is soft, nontender, nondistended, no abdominal masses GU: No CVA tenderness Lymph: No cervical or inguinal lymphadenopathy. Skin: No rashes, bruises or suspicious lesions. Neurologic: Grossly intact, no focal deficits, moving all 4 extremities. Psychiatric: Normal mood and affect.  Laboratory Data: Lab Results  Component Value Date   WBC 10.5 08/26/2017   HGB 16.5 (H) 08/26/2017   HCT 49.3 (H) 08/26/2017   MCV 89.3 08/26/2017   PLT 261 08/26/2017    Lab Results  Component Value Date   CREATININE 0.72 08/26/2017    Lab Results  Component  Value Date   HGBA1C 5.7 (H) 07/29/2017    Urinalysis Dipstick-1+ blood Microscopy negative  Pertinent Imaging: CT images were personally reviewed.  There is no hydronephrosis or nephrolithiasis/ureteral calculi.  There is a 5 mm calcification present at the bladder base.  Assessment & Plan:   52 year old female with a recent episode of renal colic.  She may have had a ureteral calculi that had passed by the time her CT was performed however she had no hydronephrosis.  A KUB was ordered to see if this calcification is still present.  She will be notified with results.  She is a recurrent stone former and desires to proceed with a metabolic evaluation.  - Urinalysis,  Complete   No Follow-up on file.  Abbie Sons, [place] [address] [place], [place] [address] [phone]

## 2017-08-28 NOTE — Telephone Encounter (Signed)
Patient returned call

## 2017-08-28 NOTE — Telephone Encounter (Signed)
Pt advised.  She says she does not have a GYN and would like to be referred to one.    Thanks,   -Mickel Baas

## 2017-08-28 NOTE — Telephone Encounter (Signed)
LMTCB 08/28/2017  Thanks,   -Mickel Baas

## 2017-08-29 ENCOUNTER — Telehealth: Payer: Self-pay

## 2017-08-29 LAB — MICROSCOPIC EXAMINATION

## 2017-08-29 LAB — URINALYSIS, COMPLETE
Bilirubin, UA: NEGATIVE
Glucose, UA: NEGATIVE
Ketones, UA: NEGATIVE
Leukocytes, UA: NEGATIVE
Nitrite, UA: NEGATIVE
Specific Gravity, UA: 1.025 (ref 1.005–1.030)
Urobilinogen, Ur: 1 mg/dL (ref 0.2–1.0)
pH, UA: 5.5 (ref 5.0–7.5)

## 2017-08-29 NOTE — Telephone Encounter (Signed)
LMOM

## 2017-08-29 NOTE — Telephone Encounter (Signed)
-----   Message from Abbie Sons, MD sent at 08/29/2017 10:46 AM EST ----- A definite calcification was not seen on KUB.  Recommend scheduling cystoscopy.

## 2017-09-01 ENCOUNTER — Telehealth: Payer: Self-pay | Admitting: Urology

## 2017-09-01 ENCOUNTER — Telehealth: Payer: Self-pay | Admitting: Obstetrics & Gynecology

## 2017-09-01 NOTE — Telephone Encounter (Signed)
Pt Dawn Summers stating she missed call on Friday from Frannie, would like for [facility] to return call at either her cell [phone] or her work # today is [phone]. Please advise. THanks.

## 2017-09-01 NOTE — Telephone Encounter (Signed)
[facility] referring for Abnormal CT scan showing a 66mm mass in cervical cuff unchanged from 2015. Lvm for patient to call back to be schedule

## 2017-09-01 NOTE — Telephone Encounter (Signed)
Pt called office asking why does she need to have a Cysto, I have scheduled the Cysto appointment but the pt still wants someone to call her and tell her why this needed to be scheduled. Please Advise. Thanks.

## 2017-09-01 NOTE — Telephone Encounter (Signed)
Spoke with patient and she will CB to schedule cysto  Dawn Summers

## 2017-09-03 ENCOUNTER — Encounter: Payer: Self-pay | Admitting: Obstetrics and Gynecology

## 2017-09-03 ENCOUNTER — Ambulatory Visit (INDEPENDENT_AMBULATORY_CARE_PROVIDER_SITE_OTHER): Payer: BLUE CROSS/BLUE SHIELD | Admitting: Obstetrics and Gynecology

## 2017-09-03 VITALS — BP 128/86 | Ht 65.0 in | Wt 182.0 lb

## 2017-09-03 DIAGNOSIS — Z8541 Personal history of malignant neoplasm of cervix uteri: Secondary | ICD-10-CM

## 2017-09-03 DIAGNOSIS — R1900 Intra-abdominal and pelvic swelling, mass and lump, unspecified site: Secondary | ICD-10-CM

## 2017-09-04 ENCOUNTER — Encounter: Payer: Self-pay | Admitting: Obstetrics and Gynecology

## 2017-09-04 NOTE — Progress Notes (Signed)
Obstetrics & Gynecology Office Visit    Chief Complaint  Patient presents with  . Referral    [facility], Fenton Malling, [place]  Pelvic mass seen on CT scan at [facility] ER in patient with personal history of cervical cancer.  History of Present Illness: 52 y.o. G35P2002 female who is seen in referral from Fenton Malling, [place], from [facility], for the above.  The patient states that she was seen in the ER 2 days ago for kidney stones. She had a CT scan as a part of this work up.  The report describes a 16 mm proteinaceous dense mass within the cervical cuff.  The comparison CT was from 03/31/2014.  The report refers to the mass as unchanged.  In 1994 the patient underwent a TVH for what she calls cervical cancer. She states that she had no chemotherapy or radiation therapy.  She recently underwent a pelvic exam on 07/29/17 with a normal pap smear (post hysterectomy) with negative HPV.  She denies vaginal bleeding, weight loss, early satiety.    Past Medical History:  Diagnosis Date  . Cancer (HCC)    cervical  . Carpal tunnel syndrome   . GERD (gastroesophageal reflux disease)   . Hyperlipidemia   . Insomnia   . Sleep apnea   . Vitamin D deficiency     Past Surgical History:  Procedure Laterality Date  . Bladder "Pack"  2010  . BREAST BIOPSY Left 2016   [place] bx benign  . CERVICAL SPINE SURGERY     C-3 and C-4 fusion  . VAGINAL HYSTERECTOMY  1994   cervical cancer, ovaries intact    Gynecologic History: No LMP recorded. Patient has had a hysterectomy.  Obstetric History: [id number]  Family History  Problem Relation Age of Onset  . Diabetes Mother   . Lung disease Mother        chronic  . Congestive Heart Failure Mother   . Coronary artery disease Mother   . Stroke Father   . Cancer Father        prostate cancer  . Hyperlipidemia Father   . Hypertension Father   . Colon polyps Father   . COPD Father   . Cancer Sister        lung cancer  .  Healthy Daughter   . Healthy Daughter   . Breast cancer Neg Hx     Social History   Socioeconomic History  . Marital status: Single    Spouse name: Not on file  . Number of children: 2  . Years of education: Not on file  . Highest education level: Not on file  Social Needs  . Financial resource strain: Not on file  . Food insecurity - worry: Not on file  . Food insecurity - inability: Not on file  . Transportation needs - medical: Not on file  . Transportation needs - non-medical: Not on file  Occupational History    Employer: [employer]  Tobacco Use  . Smoking status: Former Smoker    Last attempt to quit: 11/22/2004    Years since quitting: 12.7  . Smokeless tobacco: Never Used  Substance and Sexual Activity  . Alcohol use: Yes    Alcohol/week: 0.0 oz  . Drug use: No  . Sexual activity: Not Currently    Birth control/protection: Surgical  Other Topics Concern  . Not on file  Social History Narrative  . Not on file    Allergies  Allergen Reactions  . Latex  Other reaction(s): Unknown  . Penicillins Hives  . Sulfa Antibiotics Nausea And Vomiting  . Trazodone And Nefazodone Nausea Only and Other (See Comments)    dizziness    Prior to Admission medications   Medication Sig Start Date End Date Taking? Authorizing Provider  ibuprofen (MOTRIN IB) 200 MG tablet Take 3 tablets (600 mg total) by mouth every 6 (six) hours as needed. 08/26/17   Schuyler Amor, MD  ondansetron (ZOFRAN) 4 MG tablet Take 1 tablet (4 mg total) by mouth every 8 (eight) hours as needed for nausea or vomiting. 08/26/17   Schuyler Amor, MD  oxyCODONE-acetaminophen (PERCOCET) 5-325 MG tablet Take 1 tablet by mouth every 4 (four) hours as needed for severe pain. 08/26/17   Schuyler Amor, MD  pantoprazole (PROTONIX) 40 MG tablet Take 1 tablet (40 mg total) by mouth daily. 08/05/17   Lucilla Lame, MD  tamsulosin (FLOMAX) 0.4 MG CAPS capsule Take 1 capsule (0.4 mg total) by mouth daily. 08/26/17    Schuyler Amor, MD    Review of Systems  Constitutional: Negative.   HENT: Negative.   Eyes: Negative.   Respiratory: Negative.   Cardiovascular: Negative.   Gastrointestinal: Negative.   Genitourinary: Negative.   Musculoskeletal: Negative.   Skin: Negative.   Neurological: Negative.   Psychiatric/Behavioral: Negative.      Physical Exam BP 128/86   Ht 5\' 5"  (1.651 m)   Wt 182 lb (82.6 kg)   BMI 30.29 kg/m  No LMP recorded. Patient has had a hysterectomy. Physical Exam  Constitutional: She is oriented to person, place, and time. She appears well-developed and well-nourished. No distress.  Genitourinary: Pelvic exam was performed with patient supine. There is no rash, tenderness or lesion on the right labia. There is no rash, tenderness or lesion on the left labia. Vagina exhibits no lesion. No erythema, tenderness or bleeding in the vagina. No signs of injury around the vagina. Right adnexum does not display mass, does not display tenderness and does not display fullness. Left adnexum does not display mass, does not display tenderness and does not display fullness.  Genitourinary Comments: Cervix and uterus surgically absent.  There are no areas of induration, no masses palpated in the vagina, especially along the vaginal cuff.  A rectovaginal exam is confirmatory.   Eyes: EOM are normal. No scleral icterus.  Neck: Normal range of motion. Neck supple.  Cardiovascular: Normal rate and regular rhythm.  Pulmonary/Chest: Effort normal and breath sounds normal. No respiratory distress. She has no wheezes. She has no rales.  Abdominal: Soft. Bowel sounds are normal. She exhibits no distension and no mass. There is no tenderness. There is no rebound and no guarding.  Musculoskeletal: Normal range of motion. She exhibits no edema.  Neurological: She is alert and oriented to person, place, and time. No cranial nerve deficit.  Skin: Skin is warm and dry. No erythema.  Psychiatric: She has  a normal mood and affect. Her behavior is normal. Judgment normal.   Female chaperone present for pelvic and breast  portions of the physical exam  Assessment: 52 y.o. G7P2002 female here for  1. Pelvic mass   2. History of cervical cancer in adulthood      Plan: Problem List Items Addressed This Visit      Other   History of cervical cancer in adulthood    Other Visit Diagnoses    Pelvic mass    -  Primary     I have reviewed  the ultrasound and labs from the ER visit as well as labs from her visit on 07/29/2017.  Her CT scan findings are stable for over three years.  She has no symptoms and is 25 years out from her cancer.  There are no suggestive findings on exam. I reassured the patient and do not believe any further follow up is warranted that is in addition to the usual care she has been receiving.  She was given instructions to let me know if she developed vaginal bleeding or any other concerning pelvic symptoms.  20 minutes spent in face to face discussion with > 50% spent in counseling,management, and coordination of care of her pelvic mass and history of cervical cancer.   Prentice Docker, MD 09/04/2017 5:05 PM     CC: Mar Daring, PA-C [place], [place] [address]

## 2017-09-09 NOTE — Telephone Encounter (Signed)
LMOM

## 2017-09-09 NOTE — Telephone Encounter (Signed)
Spoke with pt in reference to purpose of cysto. Pt voiced understanding.

## 2017-09-15 ENCOUNTER — Ambulatory Visit (INDEPENDENT_AMBULATORY_CARE_PROVIDER_SITE_OTHER): Payer: BLUE CROSS/BLUE SHIELD | Admitting: Urology

## 2017-09-15 ENCOUNTER — Encounter: Payer: Self-pay | Admitting: Urology

## 2017-09-15 VITALS — BP 132/84 | HR 66 | Resp 16 | Ht 65.0 in | Wt 180.4 lb

## 2017-09-15 DIAGNOSIS — N210 Calculus in bladder: Secondary | ICD-10-CM | POA: Diagnosis not present

## 2017-09-15 DIAGNOSIS — R3129 Other microscopic hematuria: Secondary | ICD-10-CM

## 2017-09-15 LAB — URINALYSIS, COMPLETE
Bilirubin, UA: NEGATIVE
Glucose, UA: NEGATIVE
Ketones, UA: NEGATIVE
Nitrite, UA: NEGATIVE
Protein, UA: NEGATIVE
RBC, UA: NEGATIVE
Specific Gravity, UA: 1.01 (ref 1.005–1.030)
Urobilinogen, Ur: 0.2 mg/dL (ref 0.2–1.0)
pH, UA: 6 (ref 5.0–7.5)

## 2017-09-15 LAB — MICROSCOPIC EXAMINATION: RBC, UA: NONE SEEN /hpf (ref 0–2)

## 2017-09-15 NOTE — Progress Notes (Signed)
   09/15/17  CC:  Chief Complaint  Patient presents with  . Cysto    HPI: Refer to my previous note of 08/28/2017.  She is still complaining of right flank/right lower quadrant pain.  KUB was negative  Blood pressure 132/84, pulse 66, resp. rate 16, height 5\' 5"  (1.651 m), weight 180 lb 6.4 oz (81.8 kg), SpO2 98 %. NED. A&Ox3.   No respiratory distress   Abd soft, NT, ND Normal external genitalia with patent urethral meatus  Cystoscopy Procedure Note  Patient identification was confirmed, informed consent was obtained, and patient was prepped using Betadine solution.  Lidocaine jelly was administered per urethral meatus.    Preoperative abx where received prior to procedure.    Procedure: - Flexible cystoscope introduced, without any difficulty.   - Thorough search of the bladder revealed:    normal urethral meatus    normal urothelium    no stones    no ulcers     no tumors    no urethral polyps    no trabeculation  - Ureteral orifices were normal in position and appearance.  -On retroflex there was an area at the bladder neck and the tip of calcification was identified.  It appeared to be recessed into the mucosa.  Post-Procedure: - Patient tolerated the procedure well  Assessment/ Plan: Her noncontrast CT scan did not suggest an ectopic ureter.  Have recommended a CT urogram for further evaluation.   Abbie Sons, MD

## 2017-09-24 ENCOUNTER — Ambulatory Visit
Admission: RE | Admit: 2017-09-24 | Discharge: 2017-09-24 | Disposition: A | Discharge status: Home / Self Care | Payer: BLUE CROSS/BLUE SHIELD | Source: Ambulatory Visit | Attending: Urology | Admitting: Urology

## 2017-09-24 DIAGNOSIS — K449 Diaphragmatic hernia without obstruction or gangrene: Secondary | ICD-10-CM | POA: Diagnosis not present

## 2017-09-24 DIAGNOSIS — R3129 Other microscopic hematuria: Secondary | ICD-10-CM

## 2017-09-24 MED ORDER — IOPAMIDOL (ISOVUE-300) INJECTION 61%
125.0000 mL | Freq: Once | INTRAVENOUS | Status: AC | PRN
  Administered 2017-09-24: 125 mL via INTRAVENOUS

## 2017-09-29 ENCOUNTER — Other Ambulatory Visit: Payer: Self-pay | Admitting: Urology

## 2017-09-29 ENCOUNTER — Telehealth: Payer: Self-pay

## 2017-09-29 ENCOUNTER — Telehealth: Payer: Self-pay | Admitting: Urology

## 2017-09-29 DIAGNOSIS — N361 Urethral diverticulum: Secondary | ICD-10-CM

## 2017-09-29 NOTE — Telephone Encounter (Signed)
Spoke with pt in reference to CT results and MRI. Pt stated that she cant have a MRI due to wire in her neck. Please advise.

## 2017-09-29 NOTE — Telephone Encounter (Signed)
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  She needs a pelvic MRI for further evaluation.  Order was entered

## 2017-09-29 NOTE — Telephone Encounter (Signed)
They are relaxing some of the guidelines especially for MRIs of the pelvis and metal in the head neck region.  Would get an opinion from MRI staff.  She did have an MRI of her spine in 2017.

## 2017-09-29 NOTE — Telephone Encounter (Signed)
Pt called office asking about her CT results.  Please advise.  Thanks.

## 2017-09-29 NOTE — Telephone Encounter (Signed)
Spoke with pt in reference to MRI concerns. Pt voiced understanding.

## 2017-09-29 NOTE — Telephone Encounter (Signed)
LMOM

## 2017-10-07 ENCOUNTER — Ambulatory Visit
Admission: RE | Admit: 2017-10-07 | Discharge: 2017-10-07 | Disposition: A | Discharge status: Home / Self Care | Payer: BLUE CROSS/BLUE SHIELD | Source: Ambulatory Visit | Attending: Urology | Admitting: Urology

## 2017-10-07 DIAGNOSIS — N361 Urethral diverticulum: Secondary | ICD-10-CM

## 2017-10-07 MED ORDER — GADOBENATE DIMEGLUMINE 529 MG/ML IV SOLN
15.0000 mL | Freq: Once | INTRAVENOUS | Status: AC | PRN
  Administered 2017-10-07: 15 mL via INTRAVENOUS

## 2017-10-09 ENCOUNTER — Telehealth: Payer: Self-pay

## 2017-10-09 ENCOUNTER — Telehealth: Payer: Self-pay | Admitting: Urology

## 2017-10-09 NOTE — Telephone Encounter (Signed)
Lm for pt to cb to schedule app  Sharyn Lull

## 2017-10-09 NOTE — Telephone Encounter (Signed)
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  Needs an appt with Dr. Matilde Sprang

## 2017-10-09 NOTE — Telephone Encounter (Signed)
Appt made with Dr Matilde Sprang.

## 2017-10-09 NOTE — Telephone Encounter (Signed)
Left pt mess to call Please schedule apt w/MacDiarmid

## 2017-11-10 ENCOUNTER — Encounter: Payer: Self-pay | Admitting: Urology

## 2017-11-10 ENCOUNTER — Ambulatory Visit (INDEPENDENT_AMBULATORY_CARE_PROVIDER_SITE_OTHER): Payer: BLUE CROSS/BLUE SHIELD | Admitting: Urology

## 2017-11-10 VITALS — BP 129/72 | HR 64 | Ht 65.0 in | Wt 183.3 lb

## 2017-11-10 DIAGNOSIS — N361 Urethral diverticulum: Secondary | ICD-10-CM

## 2017-11-10 NOTE — Progress Notes (Signed)
11/10/2017 2:01 PM   Dawn Summers August 14, 1965 [id number]  Referring provider: Mar Daring, PA-C [place], [place] [address]  Chief Complaint  Patient presents with  . Follow-up    urethera diverticulum    HPI: Dr Chauncey Cruel:  Dawn Summers is a 52 year old female who presented to the [facility] ED on 08/26/2017 with acute onset of right flank pain radiating to the right lower quadrant which had started approximately 8 hours prior to her presentation.  The pain was described as severe and intermittent.  She had nausea and vomiting.    A stone protocol CT of the abdomen and pelvis was performed which showed no nephrolithiasis or ureteral calculi.  There was a 5 mm calcification present in the bladder.  After her visit her pain resolved however she has had recurrent pain today.  She has had 3 previous stone episodes and has not required any surgical intervention.  She is on tamsulosin.  She has not required any narcotic pain medication.  He felt that she likely had passed the stone  The patient then was assessed for a possible 44mm proteinaceous dense mass within the cervical cuff and the pelvic examination looked normal.  Apparently the CT findings were stable over 3 years.  Reassurance given in March 2019  The patient recently had cystoscopy.  When the scope was retroflexed there was a calcification identified at the bladder neck recessed into the mucosa.  A repeat CT scan in April noted a small calcification at the junction of the bladder base and proximal urethra 8 mm in size.  There was some hyper attenuation of the bladder base and along the expected left side of the proximal urethra.  It was felt that she may have a diverticulum complicated by prior infection or inflammation with extension into the bladder base.  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  She gets up once or twice at night.  She leaks with coughing sneezing bending lifting and sometimes with urgency and they are both significant.  She wears 2 pads a day that generally are damp but the amount varies.  She has no bedwetting.  She does not have post void dribbling.  She does not get recurrent bladder infections and does not have bladder surgery.  She has not felt a mass in the vagina.  Sometimes she will get a sharp discomfort in the vagina that comes and goes and then she feels like she needs to urinate.  Sometimes she has noticed some blood with wiping.  She has had a hysterectomy  Modifying factors: There are no other modifying factors  Associated signs and symptoms: There are no other associated signs and symptoms Aggravating and relieving factors: There are no other aggravating or relieving factors Severity: Moderate Duration: Persistent  PMH: Past Medical History:  Diagnosis Date  . Cancer (HCC)    cervical  . Carpal tunnel syndrome   . GERD (gastroesophageal reflux disease)   . Hyperlipidemia   . Insomnia   . Sleep apnea   . Vitamin D deficiency     Surgical History: Past Surgical History:  Procedure Laterality Date  . Bladder "Pack"  2010  . BREAST BIOPSY Left 2016   [place] bx benign  . CERVICAL SPINE SURGERY     C-3 and C-4 fusion  .  VAGINAL HYSTERECTOMY  1994   cervical cancer, ovaries intact    Home Medications:  Allergies as of 11/10/2017      Reactions   Latex    Other reaction(s): Unknown   Penicillins Hives   Sulfa Antibiotics Nausea And Vomiting   Trazodone And Nefazodone Nausea Only, Other (See Comments)   dizziness      Medication List        Accurate as of 11/10/17  2:01 PM. Always use your most recent med list.          ibuprofen 200 MG tablet Commonly known as:  MOTRIN IB Take 3 tablets (600 mg total) by mouth every 6 (six) hours as needed.   pantoprazole 40 MG tablet Commonly known as:  PROTONIX Take 1  tablet (40 mg total) by mouth daily.       Allergies:  Allergies  Allergen Reactions  . Latex     Other reaction(s): Unknown  . Penicillins Hives  . Sulfa Antibiotics Nausea And Vomiting  . Trazodone And Nefazodone Nausea Only and Other (See Comments)    dizziness    Family History: Family History  Problem Relation Age of Onset  . Diabetes Mother   . Lung disease Mother        chronic  . Congestive Heart Failure Mother   . Coronary artery disease Mother   . Stroke Father   . Cancer Father        prostate cancer  . Hyperlipidemia Father   . Hypertension Father   . Colon polyps Father   . COPD Father   . Cancer Sister        lung cancer  . Healthy Daughter   . Healthy Daughter   . Breast cancer Neg Hx     Social History:  reports that she quit smoking about 12 years ago. She has never used smokeless tobacco. She reports that she drinks alcohol. She reports that she does not use drugs.  ROS: UROLOGY Frequent Urination?: Yes Hard to postpone urination?: No Burning/pain with urination?: No Get up at night to urinate?: Yes Leakage of urine?: Yes Urine stream starts and stops?: No Trouble starting stream?: No Do you have to strain to urinate?: No Blood in urine?: No Urinary tract infection?: No Sexually transmitted disease?: No Injury to kidneys or bladder?: No Painful intercourse?: No Weak stream?: No Currently pregnant?: No Vaginal bleeding?: No Last menstrual period?: n  Gastrointestinal Nausea?: No Vomiting?: No Indigestion/heartburn?: Yes Diarrhea?: No Constipation?: No  Constitutional Fever: No Night sweats?: No Weight loss?: No Fatigue?: Yes  Skin Skin rash/lesions?: No Itching?: No  Eyes Blurred vision?: No Double vision?: No  Ears/Nose/Throat Sore throat?: No Sinus problems?: Yes  Hematologic/Lymphatic Swollen glands?: No Easy bruising?: Yes  Cardiovascular Leg swelling?: No Chest pain?: No  Respiratory Cough?:  Yes Shortness of breath?: No  Endocrine Excessive thirst?: No  Musculoskeletal Back pain?: Yes Joint pain?: No  Neurological Headaches?: No Dizziness?: No  Psychologic Depression?: No Anxiety?: No  Physical Exam: BP 129/72 (BP Location: Right Arm, Patient Position: Sitting, Cuff Size: Normal)   Pulse 64   Ht 5\' 5"  (1.651 m)   Wt 183 lb 4.8 oz (83.1 kg)   BMI 30.50 kg/m   Constitutional:  Alert and oriented, No acute distress. HEENT: [place] AT, moist mucus membranes.  Trachea midline, no masses. Cardiovascular: No clubbing, cyanosis, or edema. Respiratory: Normal respiratory effort, no increased work of breathing. GI: Abdomen is soft, nontender, nondistended, no abdominal masses GU: On  physical examination I did not see an obvious diverticulum.  She had a mild suburethral swelling approximately the mid urethra but it was symmetric on both sides.  It was nontender.  I cannot palpate any tenderness or stone.  There is no calcification.  There is no stress incontinence. Skin: No rashes, bruises or suspicious lesions. Lymph: No cervical or inguinal adenopathy. Neurologic: Grossly intact, no focal deficits, moving all 4 extremities. Psychiatric: Normal mood and affect.  Laboratory Data: Lab Results  Component Value Date   WBC 10.5 08/26/2017   HGB 16.5 (H) 08/26/2017   HCT 49.3 (H) 08/26/2017   MCV 89.3 08/26/2017   PLT 261 08/26/2017    Lab Results  Component Value Date   CREATININE 0.72 08/26/2017    No results found for: PSA  No results found for: TESTOSTERONE  Lab Results  Component Value Date   HGBA1C 5.7 (H) 07/29/2017    Urinalysis    Component Value Date/Time   COLORURINE YELLOW (A) 08/26/2017 1614   APPEARANCEUR Clear 09/15/2017 1028   LABSPEC 1.021 08/26/2017 1614   PHURINE 5.0 08/26/2017 1614   GLUCOSEU Negative 09/15/2017 1028   HGBUR SMALL (A) 08/26/2017 1614   BILIRUBINUR Negative 09/15/2017 1028   KETONESUR NEGATIVE 08/26/2017 1614    PROTEINUR Negative 09/15/2017 1028   PROTEINUR NEGATIVE 08/26/2017 1614   UROBILINOGEN 0.2 07/29/2017 1143   NITRITE Negative 09/15/2017 1028   NITRITE NEGATIVE 08/26/2017 1614   LEUKOCYTESUR Trace (A) 09/15/2017 1028    Pertinent Imaging: As noted  Assessment & Plan: Reviewed the x-rays and 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  It appears she likely has a small stone related.  She reports recurrent kidney stones but am not certain if she has ever been proven to have one.  She thinks she has had flank pain 3 or 4 times.  This would not be related to the pelvic findings.  A picture was drawn.  She understands that the diverticulum may be difficult to find under anesthesia.  Of course if I could find the opening to the urethra and passed a balloon it would help identify the diverticulum.  The role of repeat cystoscopy was discussed.  In my opinion it appears a diverticulum is actually not symptomatic.  Rare risk of cancer discussed.  Exam under anesthesia discussed.  We both agreed watchful waiting and I will reexamine her in 1 year.  I did not order a repeat x-ray in 1 year. UDS not ordered  There are no diagnoses linked to this encounter.  No follow-ups on file.  Reece Packer, MD  [facility] [address], [place], [place] [address] [phone]

## 2018-04-29 ENCOUNTER — Ambulatory Visit (INDEPENDENT_AMBULATORY_CARE_PROVIDER_SITE_OTHER): Payer: BLUE CROSS/BLUE SHIELD

## 2018-04-29 DIAGNOSIS — Z23 Encounter for immunization: Secondary | ICD-10-CM

## 2018-05-20 ENCOUNTER — Encounter: Payer: Self-pay | Admitting: Physician Assistant

## 2018-05-20 ENCOUNTER — Ambulatory Visit: Payer: BLUE CROSS/BLUE SHIELD | Admitting: Physician Assistant

## 2018-05-20 VITALS — BP 122/88 | HR 78 | Temp 98.2°F | Resp 16 | Wt 186.0 lb

## 2018-05-20 DIAGNOSIS — S8411XA Injury of peroneal nerve at lower leg level, right leg, initial encounter: Secondary | ICD-10-CM | POA: Diagnosis not present

## 2018-05-20 DIAGNOSIS — J0140 Acute pansinusitis, unspecified: Secondary | ICD-10-CM

## 2018-05-20 DIAGNOSIS — K219 Gastro-esophageal reflux disease without esophagitis: Secondary | ICD-10-CM | POA: Diagnosis not present

## 2018-05-20 MED ORDER — ESOMEPRAZOLE MAGNESIUM 40 MG PO CPDR: 40.0000 mg | DELAYED_RELEASE_CAPSULE | Freq: Every day | ORAL | 1 refills | Status: DC

## 2018-05-20 MED ORDER — PREDNISONE 10 MG (21) PO TBPK: ORAL_TABLET | ORAL | 0 refills | Status: DC

## 2018-05-20 MED ORDER — DOXYCYCLINE HYCLATE 100 MG PO TABS: 100.0000 mg | ORAL_TABLET | Freq: Two times a day (BID) | ORAL | 0 refills | Status: DC

## 2018-05-20 NOTE — Progress Notes (Signed)
Patient: Dawn Summers Female    DOB: March 12, 1966   52 y.o.   MRN: [id number] Visit Date: 05/20/2018  Today's Provider: Mar Daring, PA-C   No chief complaint on file.  Subjective:    URI   This is a new problem. The current episode started 1 to 4 weeks ago. The problem has been gradually worsening. There has been no fever. Associated symptoms include congestion, coughing, ear pain (Left ear), headaches, sinus pain, sneezing and a sore throat. Pertinent negatives include no chest pain, rhinorrhea, swollen glands or wheezing. She has tried decongestant (Dayquil and Nyquil tablets and flu and Cold) for the symptoms. The treatment provided no relief.   Patient reports that she doesn't have any feeling on her lower right leg, just below the knee and lateral to the knee. No swelling. She reports that if she touches it it feels like "needles" for the past 2 weeks. Her dog jumped on her leg right before she noticed the symptoms starting. No issues walking. Only tender to touch.    Allergies  Allergen Reactions  . Latex     Other reaction(s): Unknown  . Penicillins Hives  . Sulfa Antibiotics Nausea And Vomiting  . Trazodone And Nefazodone Nausea Only and Other (See Comments)    dizziness     Current Outpatient Medications:  .  Esomeprazole Magnesium (NEXIUM PO), Take by mouth., Disp: , Rfl:   Review of Systems  Constitutional: Positive for diaphoresis. Negative for chills and fever.  HENT: Positive for congestion, ear pain (Left ear), postnasal drip, sinus pressure, sinus pain, sneezing, sore throat and voice change. Negative for rhinorrhea and trouble swallowing.   Respiratory: Positive for cough and shortness of breath. Negative for chest tightness and wheezing.   Cardiovascular: Negative for chest pain, palpitations and leg swelling.  Neurological: Positive for numbness (right leg) and headaches.    Social History   Tobacco Use  . Smoking status: Former Smoker   Last attempt to quit: 11/22/2004    Years since quitting: 13.4  . Smokeless tobacco: Never Used  Substance Use Topics  . Alcohol use: Yes    Alcohol/week: 0.0 standard drinks   Objective:   BP 122/88 (BP Location: Left Arm, Patient Position: Sitting, Cuff Size: Normal)   Pulse 78   Temp 98.2 F (36.8 C) (Oral)   Resp 16   Wt 186 lb (84.4 kg)   SpO2 97%   BMI 30.95 kg/m  Vitals:   05/20/18 1058  BP: 122/88  Pulse: 78  Resp: 16  Temp: 98.2 F (36.8 C)  TempSrc: Oral  SpO2: 97%  Weight: 186 lb (84.4 kg)     Physical Exam  Constitutional: She appears well-developed and well-nourished. No distress.  HENT:  Head: Normocephalic and atraumatic.  Right Ear: Hearing, tympanic membrane, external ear and ear canal normal.  Left Ear: Hearing, tympanic membrane, external ear and ear canal normal.  Nose: Right sinus exhibits maxillary sinus tenderness and frontal sinus tenderness. Left sinus exhibits maxillary sinus tenderness and frontal sinus tenderness.  Mouth/Throat: Uvula is midline, oropharynx is clear and moist and mucous membranes are normal. No oropharyngeal exudate.  Neck: Normal range of motion. Neck supple. No tracheal deviation present. No thyromegaly present.  Cardiovascular: Normal rate, regular rhythm and normal heart sounds. Exam reveals no gallop and no friction rub.  No murmur heard. Pulmonary/Chest: Effort normal and breath sounds normal. No stridor. No respiratory distress. She has no wheezes. She has no rales.  Musculoskeletal:       Right knee: She exhibits normal range of motion.       Legs: Lymphadenopathy:    She has no cervical adenopathy.  Skin: She is not diaphoretic.  Vitals reviewed.       Assessment & Plan:     1. Acute non-recurrent pansinusitis Worsening symptoms that have not responded to OTC medications. Will give Doxycycline as below. Continue allergy medications. Stay well hydrated and get plenty of rest. Call if no symptom improvement or  if symptoms worsen. - doxycycline (VIBRA-TABS) 100 MG tablet; Take 1 tablet (100 mg total) by mouth 2 (two) times daily.  Dispense: 20 tablet; Refill: 0  2. Injury of right peroneal nerve, initial encounter Suspect crush injury to peroneal nerve on the right LE. No drop foot noted, just superficial symptoms. Will use prednisone taper as below for inflammation. She is to call if no improvement or symptoms worsen.  - predniSONE (STERAPRED UNI-PAK 21 TAB) 10 MG (21) TBPK tablet; 6 day taper; take as directed on package instructions  Dispense: 21 tablet; Refill: 0  3. Gastroesophageal reflux disease without esophagitis Stable. Diagnosis pulled for medication refill. Continue current medical treatment plan. - esomeprazole (NEXIUM) 40 MG capsule; Take 1 capsule (40 mg total) by mouth daily.  Dispense: 90 capsule; Refill: [place], PA-C  [place]

## 2018-05-20 NOTE — Patient Instructions (Signed)
Superficial Peroneal Nerve Entrapment Superficial peroneal nerve entrapment is a condition that results from pressure on a nerve in the lower leg (superficial peroneal nerve). This nerve provides feeling to the outside half of the front of your lower leg and the top of your foot and toes. It also supplies the outer muscles of your lower leg that help your foot move outward. The superficial peroneal nerve begins below the outside of your knee and runs down along your lower leg bone (fibula) to the top of your foot. Superficial peroneal nerve entrapment can cause weakness and numbness in your leg and foot. It may also cause pain. This can happen anywhere from your lower leg to your ankle. What are the causes? This condition may be caused by:  A hard, direct hit to the outside of your lower leg.  Ankle sprains.  A break (fracture) in the fibula.  What increases the risk? This condition is more likely to develop in people who take part in certain sports or activities, such as:  Ballet dancing.  Contact sports, such as football, lacrosse, or martial arts.  Sports that involve changing direction quickly, such as soccer.  Sports that take place on uneven surfaces, such as trail running.  Sports that involve wearing high boots, such as skiing.  What are the signs or symptoms? Symptoms of this condition include:  Numbness and tingling over the outside half of your shin and the top of your foot and toes.  Pain or tenderness on the outside of your leg or top of your foot.  Inability to move your foot outward or weakness when doing so.  Symptoms of this condition may start quickly or may develop over time. They often get better with rest and get worse after activity. How is this diagnosed? This condition may be diagnosed based on:  Your symptoms and medical history.  A physical exam. During the exam, your health care provider may: ? Check for numbness and test the strength of your lower  leg muscles. ? Tap the side of your leg or ankle to see if it causes a tingling sensation. ? Inject a numbing medicine into the nerve to see if your symptoms go away.  Imaging tests, such as: ? X-rays to check your ankle and fibula for fractures. ? MRI to check tendons and ligaments. ? Ultrasound to check the nerve. ? An electrical study of the nerve's function (electromyography, or EMG).  How is this treated? Treatment for this condition may include:  Avoiding activities that make symptoms worse.  Taking anti-inflammatory pain medicines to relieve swelling and reduce pain.  Having medicines injected near your nerve to reduce pain and swelling.  Starting range-of-motion exercises and strengthening exercises.  Returning gradually to full activity.  Wearing a supportive shoe or a shoe insert (orthotic).  Surgery to take pressure off of the nerve. This may be needed if there is no improvement in 2-3 months or if there is a growth on the nerve.  Follow these instructions at home: Activity  Return to your normal activities as told by your health care provider. Ask your health care provider what activities are safe for you.  Do not do any activities that make pain or swelling worse.  Do not put full weight on your ankle until your health care provider says that you can.  Do exercises as told by your health care provider. General instructions  Wear your supportive shoe or your shoe insert as told by your health care provider.  Take over-the-counter and prescription medicines only as told by your health care provider.  Keep all follow-up visits as told by your health care provider. This is important. How is this prevented?  Wear supportive footwear that is appropriate for your athletic activity.  Make sure your shoes or boots fit well and are not too tight.  See your health care provider if you have an ankle sprain that causes pain and swelling for more than 2 weeks.  If  you start a new athletic activity, start gradually to build up your strength and flexibility. Contact a health care provider if:  Your symptoms do not improve in 2-3 months.  You have increasing weakness or numbness in your leg or foot. This information is not intended to replace advice given to you by your health care provider. Make sure you discuss any questions you have with your health care provider. Document Released: 06/10/2005 Document Revised: 02/13/2016 Document Reviewed: 05/03/2015 Elsevier Interactive Patient Education  [employer].

## 2018-06-01 ENCOUNTER — Telehealth: Payer: Self-pay | Admitting: Physician Assistant

## 2018-06-01 DIAGNOSIS — J0140 Acute pansinusitis, unspecified: Secondary | ICD-10-CM

## 2018-06-01 MED ORDER — DOXYCYCLINE HYCLATE 100 MG PO TABS: 100.0000 mg | ORAL_TABLET | Freq: Two times a day (BID) | ORAL | 0 refills | Status: DC

## 2018-06-01 NOTE — Telephone Encounter (Signed)
Please advise 

## 2018-06-01 NOTE — Telephone Encounter (Signed)
Refilled doxycycline.

## 2018-06-01 NOTE — Telephone Encounter (Signed)
Left message on vm advising rx was sent to pharmacy.

## 2018-06-01 NOTE — Telephone Encounter (Signed)
Pt states she has finished her antibiotics and is still not feeling better.  Still has itchy eyes, stuffy nose and coughing.  Pt requesting something else be called into [facility] in [place]

## 2018-06-05 ENCOUNTER — Encounter: Payer: Self-pay | Admitting: Physician Assistant

## 2018-06-05 ENCOUNTER — Ambulatory Visit: Payer: BLUE CROSS/BLUE SHIELD | Admitting: Physician Assistant

## 2018-06-05 VITALS — BP 138/85 | HR 66 | Temp 97.6°F | Resp 16 | Wt 188.0 lb

## 2018-06-05 DIAGNOSIS — J0140 Acute pansinusitis, unspecified: Secondary | ICD-10-CM

## 2018-06-05 DIAGNOSIS — J40 Bronchitis, not specified as acute or chronic: Secondary | ICD-10-CM

## 2018-06-05 MED ORDER — BENZONATATE 200 MG PO CAPS: 200.0000 mg | ORAL_CAPSULE | Freq: Three times a day (TID) | ORAL | 0 refills | Status: DC | PRN

## 2018-06-05 MED ORDER — AZITHROMYCIN 250 MG PO TABS: ORAL_TABLET | ORAL | 0 refills | Status: DC

## 2018-06-05 MED ORDER — PREDNISONE 10 MG PO TABS: ORAL_TABLET | ORAL | 0 refills | Status: DC

## 2018-06-05 NOTE — Progress Notes (Signed)
Patient: Dawn Summers Female    DOB: [birth year]   52 y.o.   MRN: [id number] Visit Date: 06/05/2018  Today's Provider: Mar Daring, PA-C   Chief Complaint  Patient presents with  . URI   Subjective:     HPI Upper Respiratory Infection: Patient complains of symptoms of a URI, possible sinusitis. Symptoms include congestion and cough. Onset of symptoms was a few weeks ago, unchanged since that time. She also c/o congestion and non productive cough for the past several days .  She is drinking plenty of fluids. Evaluation to date: none seen previously and thought to have a bacterial URI. Treatment to date: antibiotics and decongestants. Patient on second round of doxy.    Allergies  Allergen Reactions  . Latex     Other reaction(s): Unknown  . Penicillins Hives  . Sulfa Antibiotics Nausea And Vomiting  . Trazodone And Nefazodone Nausea Only and Other (See Comments)    dizziness     Current Outpatient Medications:  .  doxycycline (VIBRA-TABS) 100 MG tablet, Take 1 tablet (100 mg total) by mouth 2 (two) times daily., Disp: 20 tablet, Rfl: 0 .  esomeprazole (NEXIUM) 40 MG capsule, Take 1 capsule (40 mg total) by mouth daily., Disp: 90 capsule, Rfl: 1  Review of Systems  Constitutional: Negative.   HENT: Positive for congestion, postnasal drip and sinus pain. Negative for ear discharge, ear pain and sore throat.   Respiratory: Positive for cough and chest tightness. Negative for shortness of breath and wheezing.   Cardiovascular: Negative.   Gastrointestinal: Negative.   Neurological: Positive for headaches.    Social History   Tobacco Use  . Smoking status: Former Smoker    Last attempt to quit: 11/22/2004    Years since quitting: 13.5  . Smokeless tobacco: Never Used  Substance Use Topics  . Alcohol use: Yes    Alcohol/week: 0.0 standard drinks      Objective:   BP 138/85 (BP Location: Left Arm, Patient Position: Sitting, Cuff Size: Large)   Pulse 66    Temp 97.6 F (36.4 C) (Oral)   Resp 16   Wt 188 lb (85.3 kg)   SpO2 99%   BMI 31.28 kg/m  Vitals:   06/05/18 1121  BP: 138/85  Pulse: 66  Resp: 16  Temp: 97.6 F (36.4 C)  TempSrc: Oral  SpO2: 99%  Weight: 188 lb (85.3 kg)     Physical Exam Vitals signs reviewed.  Constitutional:      General: She is not in acute distress.    Appearance: She is well-developed. She is not diaphoretic.  HENT:     Head: Normocephalic and atraumatic.     Right Ear: Hearing, tympanic membrane, ear canal and external ear normal.     Left Ear: Hearing, tympanic membrane, ear canal and external ear normal.     Nose:     Right Sinus: Maxillary sinus tenderness and frontal sinus tenderness present.     Left Sinus: Maxillary sinus tenderness and frontal sinus tenderness present.     Mouth/Throat:     Pharynx: Uvula midline. No oropharyngeal exudate.  Neck:     Musculoskeletal: Normal range of motion and neck supple.     Thyroid: No thyromegaly.     Trachea: No tracheal deviation.  Cardiovascular:     Rate and Rhythm: Normal rate and regular rhythm.     Heart sounds: Normal heart sounds. No murmur. No friction rub. No gallop.  Pulmonary:     Effort: Pulmonary effort is normal. No respiratory distress.     Breath sounds: No stridor. Wheezing (expiratory, fine throughout) present. No rales.  Lymphadenopathy:     Cervical: No cervical adenopathy.        Assessment & Plan    1. Acute non-recurrent pansinusitis Worsening despite doxycycline x 13 days. Stop doxycycline, start zpak. Prednisone taper x 12 days instead of 6. Tessalon perles for cough. Push fluids. Rest. Call if no improvements or symptoms worsening and will get CXR.  - predniSONE (DELTASONE) 10 MG tablet; Take 6 tabs PO on day 1&2, 5 tabs PO on day 3&4, 4 tabs PO on day 5&6, 3 tabs PO on day 7&8, 2 tabs PO on day 9&10, 1 tab PO on day 11&12.  Dispense: 42 tablet; Refill: 0 - azithromycin (ZITHROMAX) 250 MG tablet; Take 2 tablets  PO on day one, and one tablet PO daily thereafter until completed.  Dispense: 6 tablet; Refill: 0  2. Bronchitis See above medical treatment plan. - predniSONE (DELTASONE) 10 MG tablet; Take 6 tabs PO on day 1&2, 5 tabs PO on day 3&4, 4 tabs PO on day 5&6, 3 tabs PO on day 7&8, 2 tabs PO on day 9&10, 1 tab PO on day 11&12.  Dispense: 42 tablet; Refill: 0 - azithromycin (ZITHROMAX) 250 MG tablet; Take 2 tablets PO on day one, and one tablet PO daily thereafter until completed.  Dispense: 6 tablet; Refill: 0 - benzonatate (TESSALON) 200 MG capsule; Take 1 capsule (200 mg total) by mouth 3 (three) times daily as needed for cough.  Dispense: 30 capsule; Refill: 0     Mar Daring, PA-C  [facility]

## 2018-06-15 ENCOUNTER — Telehealth: Payer: Self-pay

## 2018-06-15 DIAGNOSIS — J40 Bronchitis, not specified as acute or chronic: Secondary | ICD-10-CM

## 2018-06-15 DIAGNOSIS — J0140 Acute pansinusitis, unspecified: Secondary | ICD-10-CM

## 2018-06-15 MED ORDER — AZITHROMYCIN 250 MG PO TABS: ORAL_TABLET | ORAL | 0 refills | Status: DC

## 2018-06-15 NOTE — Telephone Encounter (Signed)
Sent in

## 2018-06-15 NOTE — Telephone Encounter (Signed)
Please Review

## 2018-06-15 NOTE — Telephone Encounter (Signed)
Patient is requesting a refill on Z-Pak be sent to [facility] in [place]. She states she was seen on  12/13 and symptoms unchanged. No available appointments today. CB# [phone]

## 2018-07-27 ENCOUNTER — Other Ambulatory Visit: Payer: Self-pay | Admitting: Physician Assistant

## 2018-07-27 DIAGNOSIS — Z1231 Encounter for screening mammogram for malignant neoplasm of breast: Secondary | ICD-10-CM

## 2018-07-31 ENCOUNTER — Ambulatory Visit
Admission: RE | Admit: 2018-07-31 | Discharge: 2018-07-31 | Disposition: A | Discharge status: Home / Self Care | Payer: BLUE CROSS/BLUE SHIELD | Attending: Physician Assistant | Admitting: Physician Assistant

## 2018-07-31 ENCOUNTER — Ambulatory Visit
Admission: RE | Admit: 2018-07-31 | Discharge: 2018-07-31 | Disposition: A | Discharge status: Home / Self Care | Payer: BLUE CROSS/BLUE SHIELD | Source: Ambulatory Visit | Attending: Physician Assistant | Admitting: Physician Assistant

## 2018-07-31 ENCOUNTER — Encounter: Payer: Self-pay | Admitting: Physician Assistant

## 2018-07-31 ENCOUNTER — Ambulatory Visit (INDEPENDENT_AMBULATORY_CARE_PROVIDER_SITE_OTHER): Payer: BLUE CROSS/BLUE SHIELD | Admitting: Physician Assistant

## 2018-07-31 VITALS — BP 146/92 | HR 75 | Temp 98.0°F | Resp 16 | Ht 65.0 in | Wt 191.6 lb

## 2018-07-31 DIAGNOSIS — E7800 Pure hypercholesterolemia, unspecified: Secondary | ICD-10-CM | POA: Diagnosis not present

## 2018-07-31 DIAGNOSIS — M5432 Sciatica, left side: Secondary | ICD-10-CM

## 2018-07-31 DIAGNOSIS — Z0000 Encounter for general adult medical examination without abnormal findings: Secondary | ICD-10-CM

## 2018-07-31 DIAGNOSIS — E6609 Other obesity due to excess calories: Secondary | ICD-10-CM

## 2018-07-31 DIAGNOSIS — R61 Generalized hyperhidrosis: Secondary | ICD-10-CM

## 2018-07-31 DIAGNOSIS — M25552 Pain in left hip: Secondary | ICD-10-CM | POA: Diagnosis present

## 2018-07-31 DIAGNOSIS — Z1239 Encounter for other screening for malignant neoplasm of breast: Secondary | ICD-10-CM

## 2018-07-31 DIAGNOSIS — R030 Elevated blood-pressure reading, without diagnosis of hypertension: Secondary | ICD-10-CM

## 2018-07-31 DIAGNOSIS — Z6831 Body mass index (BMI) 31.0-31.9, adult: Secondary | ICD-10-CM

## 2018-07-31 DIAGNOSIS — G479 Sleep disorder, unspecified: Secondary | ICD-10-CM | POA: Diagnosis not present

## 2018-07-31 MED ORDER — MELOXICAM 15 MG PO TABS: 15.0000 mg | ORAL_TABLET | Freq: Every day | ORAL | 0 refills | Status: DC

## 2018-07-31 NOTE — Patient Instructions (Addendum)
Call insurance about Shingrix (new shingles) vaccine  Get Blood pressure cuff and check BP x 1 week and send through [facility] Maintenance, Female Adopting a healthy lifestyle and getting preventive care can go a long way to promote health and wellness. Talk with your health care provider about what schedule of regular examinations is right for you. This is a good chance for you to check in with your provider about disease prevention and staying healthy. In between checkups, there are plenty of things you can do on your own. Experts have done a lot of research about which lifestyle changes and preventive measures are most likely to keep you healthy. Ask your health care provider for more information. Weight and diet Eat a healthy diet  Be sure to include plenty of vegetables, fruits, low-fat dairy products, and lean protein.  Do not eat a lot of foods high in solid fats, added sugars, or salt.  Get regular exercise. This is one of the most important things you can do for your health. ? Most adults should exercise for at least 150 minutes each week. The exercise should increase your heart rate and make you sweat (moderate-intensity exercise). ? Most adults should also do strengthening exercises at least twice a week. This is in addition to the moderate-intensity exercise. Maintain a healthy weight  Body mass index (BMI) is a measurement that can be used to identify possible weight problems. It estimates body fat based on height and weight. Your health care provider can help determine your BMI and help you achieve or maintain a healthy weight.  For females 52 years of age and older: ? A BMI below 18.5 is considered underweight. ? A BMI of 18.5 to 24.9 is normal. ? A BMI of 25 to 29.9 is considered overweight. ? A BMI of 30 and above is considered obese. Watch levels of cholesterol and blood lipids  You should start having your blood tested for lipids and cholesterol at 55 years of  age, then have this test every 5 years.  You may need to have your cholesterol levels checked more often if: ? Your lipid or cholesterol levels are high. ? You are older than 55 years of age. ? You are at high risk for heart disease. Cancer screening Lung Cancer  Lung cancer screening is recommended for adults 60-43 years old who are at high risk for lung cancer because of a history of smoking.  A yearly low-dose CT scan of the lungs is recommended for people who: ? Currently smoke. ? Have quit within the past 15 years. ? Have at least a 30-pack-year history of smoking. A pack year is smoking an average of one pack of cigarettes a day for 1 year.  Yearly screening should continue until it has been 15 years since you quit.  Yearly screening should stop if you develop a health problem that would prevent you from having lung cancer treatment. Breast Cancer  Practice breast self-awareness. This means understanding how your breasts normally appear and feel.  It also means doing regular breast self-exams. Let your health care provider know about any changes, no matter how small.  If you are in your 20s or 30s, you should have a clinical breast exam (CBE) by a health care provider every 1-3 years as part of a regular health exam.  If you are 31 or older, have a CBE every year. Also consider having a breast X-ray (mammogram) every year.  If you have a family history  of breast cancer, talk to your health care provider about genetic screening.  If you are at high risk for breast cancer, talk to your health care provider about having an MRI and a mammogram every year.  Breast cancer gene (BRCA) assessment is recommended for women who have family members with BRCA-related cancers. BRCA-related cancers include: ? Breast. ? Ovarian. ? Tubal. ? Peritoneal cancers.  Results of the assessment will determine the need for genetic counseling and BRCA1 and BRCA2 testing. Cervical Cancer Your  health care provider may recommend that you be screened regularly for cancer of the pelvic organs (ovaries, uterus, and vagina). This screening involves a pelvic examination, including checking for microscopic changes to the surface of your cervix (Pap test). You may be encouraged to have this screening done every 3 years, beginning at age 46.  For women ages 65-65, health care providers may recommend pelvic exams and Pap testing every 3 years, or they may recommend the Pap and pelvic exam, combined with testing for human papilloma virus (HPV), every 5 years. Some types of HPV increase your risk of cervical cancer. Testing for HPV may also be done on women of any age with unclear Pap test results.  Other health care providers may not recommend any screening for nonpregnant women who are considered low risk for pelvic cancer and who do not have symptoms. Ask your health care provider if a screening pelvic exam is right for you.  If you have had past treatment for cervical cancer or a condition that could lead to cancer, you need Pap tests and screening for cancer for at least 20 years after your treatment. If Pap tests have been discontinued, your risk factors (such as having a new sexual partner) need to be reassessed to determine if screening should resume. Some women have medical problems that increase the chance of getting cervical cancer. In these cases, your health care provider may recommend more frequent screening and Pap tests. Colorectal Cancer  This type of cancer can be detected and often prevented.  Routine colorectal cancer screening usually begins at 55 years of age and continues through 55 years of age.  Your health care provider may recommend screening at an earlier age if you have risk factors for colon cancer.  Your health care provider may also recommend using home test kits to check for hidden blood in the stool.  A small camera at the end of a tube can be used to examine your  colon directly (sigmoidoscopy or colonoscopy). This is done to check for the earliest forms of colorectal cancer.  Routine screening usually begins at age 86.  Direct examination of the colon should be repeated every 5-10 years through 55 years of age. However, you may need to be screened more often if early forms of precancerous polyps or small growths are found. Skin Cancer  Check your skin from head to toe regularly.  Tell your health care provider about any new moles or changes in moles, especially if there is a change in a mole's shape or color.  Also tell your health care provider if you have a mole that is larger than the size of a pencil eraser.  Always use sunscreen. Apply sunscreen liberally and repeatedly throughout the day.  Protect yourself by wearing long sleeves, pants, a wide-brimmed hat, and sunglasses whenever you are outside. Heart disease, diabetes, and high blood pressure  High blood pressure causes heart disease and increases the risk of stroke. High blood pressure is more  likely to develop in: ? People who have blood pressure in the high end of the normal range (130-139/85-89 mm Hg). ? People who are overweight or obese. ? People who are African American.  If you are 16-60 years of age, have your blood pressure checked every 3-5 years. If you are 38 years of age or older, have your blood pressure checked every year. You should have your blood pressure measured twice-once when you are at a hospital or clinic, and once when you are not at a hospital or clinic. Record the average of the two measurements. To check your blood pressure when you are not at a hospital or clinic, you can use: ? An automated blood pressure machine at a pharmacy. ? A home blood pressure monitor.  If you are between 56 years and 31 years old, ask your health care provider if you should take aspirin to prevent strokes.  Have regular diabetes screenings. This involves taking a blood sample to  check your fasting blood sugar level. ? If you are at a normal weight and have a low risk for diabetes, have this test once every three years after 55 years of age. ? If you are overweight and have a high risk for diabetes, consider being tested at a younger age or more often. Preventing infection Hepatitis B  If you have a higher risk for hepatitis B, you should be screened for this virus. You are considered at high risk for hepatitis B if: ? You were born in a country where hepatitis B is common. Ask your health care provider which countries are considered high risk. ? Your parents were born in a high-risk country, and you have not been immunized against hepatitis B (hepatitis B vaccine). ? You have HIV or AIDS. ? You use needles to inject street drugs. ? You live with someone who has hepatitis B. ? You have had sex with someone who has hepatitis B. ? You get hemodialysis treatment. ? You take certain medicines for conditions, including cancer, organ transplantation, and autoimmune conditions. Hepatitis C  Blood testing is recommended for: ? Everyone born from [birth year] through 1967. ? Anyone with known risk factors for hepatitis C. Sexually transmitted infections (STIs)  You should be screened for sexually transmitted infections (STIs) including gonorrhea and chlamydia if: ? You are sexually active and are younger than 55 years of age. ? You are older than 55 years of age and your health care provider tells you that you are at risk for this type of infection. ? Your sexual activity has changed since you were last screened and you are at an increased risk for chlamydia or gonorrhea. Ask your health care provider if you are at risk.  If you do not have HIV, but are at risk, it may be recommended that you take a prescription medicine daily to prevent HIV infection. This is called pre-exposure prophylaxis (PrEP). You are considered at risk if: ? You are sexually active and do not regularly use  condoms or know the HIV status of your partner(s). ? You take drugs by injection. ? You are sexually active with a partner who has HIV. Talk with your health care provider about whether you are at high risk of being infected with HIV. If you choose to begin PrEP, you should first be tested for HIV. You should then be tested every 3 months for as long as you are taking PrEP. Pregnancy  If you are premenopausal and you may become pregnant, ask  your health care provider about preconception counseling.  If you may become pregnant, take 400 to 800 micrograms (mcg) of folic acid every day.  If you want to prevent pregnancy, talk to your health care provider about birth control (contraception). Osteoporosis and menopause  Osteoporosis is a disease in which the bones lose minerals and strength with aging. This can result in serious bone fractures. Your risk for osteoporosis can be identified using a bone density scan.  If you are 45 years of age or older, or if you are at risk for osteoporosis and fractures, ask your health care provider if you should be screened.  Ask your health care provider whether you should take a calcium or vitamin D supplement to lower your risk for osteoporosis.  Menopause may have certain physical symptoms and risks.  Hormone replacement therapy may reduce some of these symptoms and risks. Talk to your health care provider about whether hormone replacement therapy is right for you. Follow these instructions at home:  Schedule regular health, dental, and eye exams.  Stay current with your immunizations.  Do not use any tobacco products including cigarettes, chewing tobacco, or electronic cigarettes.  If you are pregnant, do not drink alcohol.  If you are breastfeeding, limit how much and how often you drink alcohol.  Limit alcohol intake to no more than 1 drink per day for nonpregnant women. One drink equals 12 ounces of beer, 5 ounces of wine, or 1 ounces of  hard liquor.  Do not use street drugs.  Do not share needles.  Ask your health care provider for help if you need support or information about quitting drugs.  Tell your health care provider if you often feel depressed.  Tell your health care provider if you have ever been abused or do not feel safe at home. This information is not intended to replace advice given to you by your health care provider. Make sure you discuss any questions you have with your health care provider. Document Released: 12/24/2010 Document Revised: 11/16/2015 Document Reviewed: 03/14/2015 Elsevier Interactive Patient Education  2019 [employer].

## 2018-07-31 NOTE — Progress Notes (Signed)
Patient: Dawn Summers, Female    DOB: 1966/02/15, 53 y.o.   MRN: [id number] Visit Date: 07/31/2018  Today's Provider: Mar Daring, PA-C   Chief Complaint  Patient presents with  . Annual Exam   Subjective:     Annual physical exam Dawn Summers is a 53 y.o. female who presents today for health maintenance and complete physical. She feels fairly well. She reports exercising none. She reports she is sleeping poorly. ----------------------------------------------------------------- Pap:07/29/2017 Normal,HPV-Negative Mammogram: Scheduled 08/12/2018 Colonoscopy:06/13/2014-Normal  Reports she has always not slept great. Can fall asleep on the couch but once she gets up for bed has difficulty. She reports she "relives her work". She is a Theme park manager at [employer]. She reports trying "everything" with the previous provider here. She has also had a sleep study in the past that showed "mild sleep apnea and I was told if I lose weight I would be ok."   She also continues to have some chronic inflammation of the sinuses. Asked if she had tried OTC allergy medications and she states "those keep me awake so I can't." Also asked about nasal sprays for which she states "those make me sick, nauseous."   She also complains of frequent sweating that occurs at anytime with or without activity.   Review of Systems  Constitutional: Positive for diaphoresis and fatigue.  HENT: Positive for congestion, postnasal drip, sinus pressure and sneezing. Negative for ear pain, rhinorrhea, sinus pain, sore throat, tinnitus and trouble swallowing.   Eyes: Negative.   Respiratory: Positive for cough. Negative for chest tightness, shortness of breath and wheezing.   Cardiovascular: Negative.   Gastrointestinal: Positive for abdominal distention. Negative for blood in stool, constipation, diarrhea, nausea and vomiting.  Endocrine: Positive for polyuria.  Genitourinary: Positive for frequency. Negative  for dysuria, enuresis, flank pain, pelvic pain, urgency, vaginal bleeding, vaginal discharge and vaginal pain.  Musculoskeletal: Positive for arthralgias, back pain, gait problem (left leg with first few steps) and neck pain.  Skin: Negative.   Allergic/Immunologic: Positive for environmental allergies.  Neurological: Negative for dizziness, weakness, numbness and headaches.  Hematological: Bruises/bleeds easily.  Psychiatric/Behavioral: Positive for sleep disturbance.    Social History      She  reports that she quit smoking about 13 years ago. She has never used smokeless tobacco. She reports current alcohol use. She reports that she does not use drugs.       Social History   Socioeconomic History  . Marital status: Single    Spouse name: Not on file  . Number of children: 2  . Years of education: Not on file  . Highest education level: Not on file  Occupational History    Employer: [employer]  Social Needs  . Financial resource strain: Not on file  . Food insecurity:    Worry: Not on file    Inability: Not on file  . Transportation needs:    Medical: Not on file    Non-medical: Not on file  Tobacco Use  . Smoking status: Former Smoker    Last attempt to quit: 11/22/2004    Years since quitting: 13.6  . Smokeless tobacco: Never Used  Substance and Sexual Activity  . Alcohol use: Yes    Alcohol/week: 0.0 standard drinks  . Drug use: No  . Sexual activity: Not Currently    Birth control/protection: Surgical  Lifestyle  . Physical activity:    Days per week: Not on file    Minutes  per session: Not on file  . Stress: Not on file  Relationships  . Social connections:    Talks on phone: Not on file    Gets together: Not on file    Attends religious service: Not on file    Active member of club or organization: Not on file    Attends meetings of clubs or organizations: Not on file    Relationship status: Not on file  Other Topics Concern  . Not on file  Social  History Narrative  . Not on file    Past Medical History:  Diagnosis Date  . Cancer (HCC)    cervical  . Carpal tunnel syndrome   . GERD (gastroesophageal reflux disease)   . Hyperlipidemia   . Insomnia   . Sleep apnea   . Vitamin D deficiency      Patient Active Problem List   Diagnosis Date Noted  . Bladder calculus 08/28/2017  . H/O: pneumonia 04/17/2016  . Degeneration of intervertebral disc of lumbar region 08/07/2015  . Neuritis or radiculitis due to rupture of lumbar intervertebral disc 08/07/2015  . Lumbar radiculitis 08/07/2015  . Back ache 12/30/2014  . Benign paroxysmal positional nystagmus 12/30/2014  . Breast lump 12/30/2014  . History of cervical cancer in adulthood 12/30/2014  . Esophageal reflux 12/30/2014  . Calculus of kidney 12/30/2014  . Obstructive apnea 12/30/2014  . Snores 12/30/2014  . Blood pressure elevated without history of HTN 08/18/2009  . Radial styloid tenosynovitis 08/15/2009  . Allergic rhinitis 09/18/2007  . Carpal tunnel syndrome 09/18/2007  . Cannot sleep 09/18/2007  . Headache, migraine 09/18/2007  . Hypercholesterolemia without hypertriglyceridemia 09/18/2007    Past Surgical History:  Procedure Laterality Date  . Bladder "Pack"  2010  . BREAST BIOPSY Left 2016   [place] bx benign  . CERVICAL SPINE SURGERY     C-3 and C-4 fusion  . VAGINAL HYSTERECTOMY  1994   cervical cancer, ovaries intact    Family History        Family Status  Relation Name Status  . Mother  Deceased  . Father  Alive  . Sister 6 half-sister Alive  . Brother 5 half-brothers Alive  . Daughter  Alive  . Daughter  Alive  . Neg Hx  (Not Specified)        Her family history includes COPD in her father; Cancer in her father and sister; Colon polyps in her father; Congestive Heart Failure in her mother; Coronary artery disease in her mother; Diabetes in her mother; Healthy in her daughter and daughter; Hyperlipidemia in her father; Hypertension in her  father; Lung disease in her mother; Stroke in her father. There is no history of Breast cancer.      Allergies  Allergen Reactions  . Latex     Other reaction(s): Unknown  . Penicillins Hives  . Sulfa Antibiotics Nausea And Vomiting  . Trazodone And Nefazodone Nausea Only and Other (See Comments)    dizziness     Current Outpatient Medications:  .  azithromycin (ZITHROMAX) 250 MG tablet, Take 2 tablets PO on day one, and one tablet PO daily thereafter until completed., Disp: 6 tablet, Rfl: 0 .  benzonatate (TESSALON) 200 MG capsule, Take 1 capsule (200 mg total) by mouth 3 (three) times daily as needed for cough., Disp: 30 capsule, Rfl: 0 .  esomeprazole (NEXIUM) 40 MG capsule, Take 1 capsule (40 mg total) by mouth daily., Disp: 90 capsule, Rfl: 1 .  predniSONE (DELTASONE) 10 MG  tablet, Take 6 tabs PO on day 1&2, 5 tabs PO on day 3&4, 4 tabs PO on day 5&6, 3 tabs PO on day 7&8, 2 tabs PO on day 9&10, 1 tab PO on day 11&12., Disp: 42 tablet, Rfl: 0   Patient Care Team: Mar Daring, PA-C as PCP - General (Family Medicine)    Objective:    Vitals: BP (!) 146/92 (BP Location: Left Arm, Patient Position: Sitting, Cuff Size: Large)   Pulse 75   Temp 98 F (36.7 C) (Oral)   Resp 16   Ht 5\' 5"  (1.651 m)   Wt 191 lb 9.6 oz (86.9 kg)   BMI 31.88 kg/m    Vitals:   07/31/18 1359  BP: (!) 146/92  Pulse: 75  Resp: 16  Temp: 98 F (36.7 C)  TempSrc: Oral  Weight: 191 lb 9.6 oz (86.9 kg)  Height: 5\' 5"  (1.651 m)     Physical Exam Vitals signs reviewed.  Constitutional:      General: She is not in acute distress.    Appearance: Normal appearance. She is well-developed. She is obese. She is not diaphoretic.  HENT:     Head: Normocephalic and atraumatic.     Right Ear: Hearing, tympanic membrane, ear canal and external ear normal.     Left Ear: Hearing, tympanic membrane, ear canal and external ear normal.     Nose: Nose normal. No mucosal edema or congestion.      Right Turbinates: Not enlarged.     Left Turbinates: Not enlarged.     Right Sinus: No maxillary sinus tenderness or frontal sinus tenderness.     Left Sinus: No maxillary sinus tenderness or frontal sinus tenderness.     Mouth/Throat:     Lips: Pink.     Mouth: Mucous membranes are moist.     Pharynx: Oropharynx is clear. No oropharyngeal exudate or posterior oropharyngeal erythema.  Eyes:     General: No scleral icterus.       Right eye: No discharge.        Left eye: No discharge.     Extraocular Movements: Extraocular movements intact.     Conjunctiva/sclera: Conjunctivae normal.     Pupils: Pupils are equal, round, and reactive to light.  Neck:     Musculoskeletal: Normal range of motion and neck supple.     Thyroid: No thyromegaly.     Vascular: No carotid bruit or JVD.     Trachea: No tracheal deviation.  Cardiovascular:     Rate and Rhythm: Normal rate and regular rhythm.     Pulses: Normal pulses.     Heart sounds: Normal heart sounds. No murmur. No friction rub. No gallop.   Pulmonary:     Effort: Pulmonary effort is normal. No respiratory distress.     Breath sounds: Normal breath sounds. No wheezing or rales.  Chest:     Chest wall: No tenderness.     Breasts:        Right: Inverted nipple (chronic) present. No mass, nipple discharge, skin change or tenderness.        Left: Inverted nipple (chronic) and mass present. No nipple discharge, skin change or tenderness.    Abdominal:     General: Bowel sounds are normal. There is no distension.     Palpations: Abdomen is soft. There is no mass.     Tenderness: There is no abdominal tenderness. There is no guarding or rebound.  Musculoskeletal: Normal range of motion.  General: No tenderness.  Lymphadenopathy:     Cervical: No cervical adenopathy.     Upper Body:     Right upper body: No supraclavicular, axillary or pectoral adenopathy.     Left upper body: No supraclavicular, axillary or pectoral adenopathy.    Skin:    General: Skin is warm and dry.     Findings: No rash.  Neurological:     Mental Status: She is alert and oriented to person, place, and time.  Psychiatric:        Mood and Affect: Mood normal.        Behavior: Behavior normal.        Thought Content: Thought content normal.        Judgment: Judgment normal.      Depression Screen PHQ 2/9 Scores 07/31/2018 07/29/2017 04/17/2016  PHQ - 2 Score 0 0 0  PHQ- 9 Score - 5 -       Assessment & Plan:     Routine Health Maintenance and Physical Exam  Exercise Activities and Dietary recommendations Goals   None     Immunization History  Administered Date(s) Administered  . Influenza,inj,Quad PF,6+ Mos 05/24/2014, 04/20/2015, 04/17/2016, 07/29/2017, 04/29/2018  . Pneumococcal Polysaccharide-23 05/12/2015  . Tdap 11/24/2012    Health Maintenance  Topic Date Due  . MAMMOGRAM  08/12/2019  . PAP SMEAR-Modifier  07/29/2020  . TETANUS/TDAP  11/25/2022  . COLONOSCOPY  06/13/2024  . INFLUENZA VACCINE  Completed  . Hepatitis C Screening  Completed  . HIV Screening  Completed     Discussed health benefits of physical activity, and encouraged her to engage in regular exercise appropriate for her age and condition.    1. Annual physical exam Normal physical exam today. Will check labs as below and f/u pending lab results. If labs are stable and WNL she will not need to have these rechecked for one year at her next annual physical exam. She is to call the office in the meantime if she has any acute issue, questions or concerns. - CBC with Differential/Platelet - Comprehensive metabolic panel - Lipid panel - TSH - Hemoglobin A1c  2. Breast cancer screening Scheduled for 08/12/18. Does have chronic left breast nodule that feels stable from 2016. Reported unchanged and normal at last mammogram last year.   3. Hypercholesterolemia without hypertriglyceridemia Diet controlled. Patient has declined medications in the past.  Will check labs as below and f/u pending results. - CBC with Differential/Platelet - Comprehensive metabolic panel - Lipid panel - Hemoglobin A1c  4. Class 1 obesity due to excess calories with serious comorbidity and body mass index (BMI) of 31.0 to 31.9 in adult Counseled patient on healthy lifestyle modifications including dieting and exercise.  - CBC with Differential/Platelet - Comprehensive metabolic panel - Lipid panel - Hemoglobin A1c  5. Left hip pain Worsening left hip pain that radiates down the left leg to her toes. Occurs with first steps then improves. Does have known facet arthropathy of the lumbar spine from xray done in 2017. Will reimage and inclcude left hip. I will f/u pending results. Meloxicam given as below for inflammation. Call if worsening. I will see her back in 4 weeks.  - DG Hip Unilat W OR W/O Pelvis 2-3 Views Left; Future - DG Lumbar Spine Complete; Future - meloxicam (MOBIC) 15 MG tablet; Take 1 tablet (15 mg total) by mouth daily.  Dispense: 30 tablet; Refill: 0  6. Sciatica of left side See above medical treatment plan. - DG  Hip Unilat W OR W/O Pelvis 2-3 Views Left; Future - DG Lumbar Spine Complete; Future - meloxicam (MOBIC) 15 MG tablet; Take 1 tablet (15 mg total) by mouth daily.  Dispense: 30 tablet; Refill: 0  7. Difficulty sleeping Patient states she has tried everything. Sleep study had shown borderline OSA. Was told to lose weight. Patient does not desire treatment at this time.   8. Sweating increase Worsening. Patient has had hysterectomy so cannot determine true menopause from menstrual cycles. Will check labs as below and f/u pending results. - CBC with Differential/Platelet - Comprehensive metabolic panel - TSH - FSH/LH  9. Elevated BP without diagnosis of hypertension Elevated today. Will recheck in 4 weeks. - CBC with Differential/Platelet - Comprehensive metabolic panel - Lipid panel - TSH - Hemoglobin  A1c  --------------------------------------------------------------------    Mar Daring, PA-C  [facility]

## 2018-08-01 LAB — COMPREHENSIVE METABOLIC PANEL
ALT: 21 IU/L (ref 0–32)
AST: 15 IU/L (ref 0–40)
Albumin/Globulin Ratio: 1.9 (ref 1.2–2.2)
Albumin: 4.8 g/dL (ref 3.8–4.9)
Alkaline Phosphatase: 85 IU/L (ref 39–117)
BUN/Creatinine Ratio: 22 (ref 9–23)
BUN: 17 mg/dL (ref 6–24)
Bilirubin Total: 0.3 mg/dL (ref 0.0–1.2)
CO2: 23 mmol/L (ref 20–29)
Calcium: 10.1 mg/dL (ref 8.7–10.2)
Chloride: 100 mmol/L (ref 96–106)
Creatinine, Ser: 0.79 mg/dL (ref 0.57–1.00)
GFR calc Af Amer: 98 mL/min/{1.73_m2} (ref >59)
GFR calc non Af Amer: 85 mL/min/{1.73_m2} (ref >59)
Globulin, Total: 2.5 g/dL (ref 1.5–4.5)
Glucose: 88 mg/dL (ref 65–99)
Potassium: 4.3 mmol/L (ref 3.5–5.2)
Sodium: 142 mmol/L (ref 134–144)
Total Protein: 7.3 g/dL (ref 6.0–8.5)

## 2018-08-01 LAB — CBC WITH DIFFERENTIAL/PLATELET
Basophils Absolute: 0.1 10*3/uL (ref 0.0–0.2)
Basos: 1 %
EOS (ABSOLUTE): 0.1 10*3/uL (ref 0.0–0.4)
Eos: 1 %
Hematocrit: 46.7 % — ABNORMAL HIGH (ref 34.0–46.6)
Hemoglobin: 16.1 g/dL — ABNORMAL HIGH (ref 11.1–15.9)
Immature Grans (Abs): 0.1 10*3/uL (ref 0.0–0.1)
Immature Granulocytes: 1 %
Lymphocytes Absolute: 2.6 10*3/uL (ref 0.7–3.1)
Lymphs: 31 %
MCH: 30 pg (ref 26.6–33.0)
MCHC: 34.5 g/dL (ref 31.5–35.7)
MCV: 87 fL (ref 79–97)
Monocytes Absolute: 0.5 10*3/uL (ref 0.1–0.9)
Monocytes: 6 %
Neutrophils Absolute: 5.1 10*3/uL (ref 1.4–7.0)
Neutrophils: 60 %
Platelets: 299 10*3/uL (ref 150–450)
RBC: 5.36 x10E6/uL — ABNORMAL HIGH (ref 3.77–5.28)
RDW: 13 % (ref 11.7–15.4)
WBC: 8.5 10*3/uL (ref 3.4–10.8)

## 2018-08-01 LAB — FSH/LH
FSH: 110.8 m[IU]/mL
LH: 62.4 m[IU]/mL

## 2018-08-01 LAB — LIPID PANEL
Chol/HDL Ratio: 2.8 ratio (ref 0.0–4.4)
Cholesterol, Total: 251 mg/dL — ABNORMAL HIGH (ref 100–199)
HDL: 91 mg/dL (ref >39)
LDL Calculated: 140 mg/dL — ABNORMAL HIGH (ref 0–99)
Triglycerides: 98 mg/dL (ref 0–149)
VLDL Cholesterol Cal: 20 mg/dL (ref 5–40)

## 2018-08-01 LAB — TSH: TSH: 1.11 u[IU]/mL (ref 0.450–4.500)

## 2018-08-01 LAB — HEMOGLOBIN A1C
Est. average glucose Bld gHb Est-mCnc: 117 mg/dL
Hgb A1c MFr Bld: 5.7 % — ABNORMAL HIGH (ref 4.8–5.6)

## 2018-08-03 ENCOUNTER — Telehealth: Payer: Self-pay

## 2018-08-03 DIAGNOSIS — M4726 Other spondylosis with radiculopathy, lumbar region: Secondary | ICD-10-CM

## 2018-08-03 DIAGNOSIS — M5136 Other intervertebral disc degeneration, lumbar region: Secondary | ICD-10-CM

## 2018-08-03 NOTE — Telephone Encounter (Signed)
-----   Message from Mar Daring, [place] sent at 08/03/2018  7:32 AM EST ----- Blood count is stable. Kidney and liver function normal. A1c/sugar stable at 5.7. Cholesterol increased from last time but HDL (good) cholesterol is much higher, which is why total increased as much. LDL is pretty stable. No need for cholesterol lowering medications at this time. Hormones indicate menopausal.

## 2018-08-03 NOTE — Telephone Encounter (Addendum)
Patient returned call and states she would like to have the orthopedics referral placed. Patient is aware that someone will contact her once the referral is place to schedule her appointment.

## 2018-08-03 NOTE — Telephone Encounter (Signed)
Patient was advised.  

## 2018-08-03 NOTE — Telephone Encounter (Signed)
-----   Message from Mar Daring, [place] sent at 08/03/2018  7:30 AM EST ----- Left shifted scoliosis with degenerative disc disease and arthritic changes noted in lumbar spine.

## 2018-08-03 NOTE — Telephone Encounter (Signed)
Referral placed for her 

## 2018-08-03 NOTE — Telephone Encounter (Signed)
[facility] 

## 2018-08-03 NOTE — Telephone Encounter (Signed)
Patient was advised and would like to know what further actions she needs to do considering this matter.

## 2018-08-03 NOTE — Addendum Note (Signed)
Addended by: Mar Daring on: 08/03/2018 04:00 PM   Modules accepted: Orders

## 2018-08-03 NOTE — Telephone Encounter (Signed)
I would recommend orthopedics referral or PT or both.

## 2018-08-12 ENCOUNTER — Ambulatory Visit
Admission: RE | Admit: 2018-08-12 | Discharge: 2018-08-12 | Disposition: A | Discharge status: Home / Self Care | Payer: BLUE CROSS/BLUE SHIELD | Source: Ambulatory Visit | Attending: Physician Assistant | Admitting: Physician Assistant

## 2018-08-12 DIAGNOSIS — Z1231 Encounter for screening mammogram for malignant neoplasm of breast: Secondary | ICD-10-CM | POA: Insufficient documentation

## 2018-08-13 ENCOUNTER — Other Ambulatory Visit: Payer: Self-pay | Admitting: Physician Assistant

## 2018-08-13 DIAGNOSIS — N6320 Unspecified lump in the left breast, unspecified quadrant: Secondary | ICD-10-CM

## 2018-08-28 ENCOUNTER — Ambulatory Visit
Admission: RE | Admit: 2018-08-28 | Discharge: 2018-08-28 | Disposition: A | Discharge status: Home / Self Care | Payer: BLUE CROSS/BLUE SHIELD | Source: Ambulatory Visit | Attending: Physician Assistant | Admitting: Physician Assistant

## 2018-08-28 ENCOUNTER — Encounter: Payer: Self-pay | Admitting: Physician Assistant

## 2018-08-28 ENCOUNTER — Ambulatory Visit: Payer: Self-pay | Admitting: Physician Assistant

## 2018-08-28 ENCOUNTER — Ambulatory Visit: Payer: BLUE CROSS/BLUE SHIELD | Admitting: Physician Assistant

## 2018-08-28 VITALS — BP 133/89 | HR 67 | Temp 97.2°F | Resp 16 | Ht 65.0 in | Wt 197.0 lb

## 2018-08-28 DIAGNOSIS — M5136 Other intervertebral disc degeneration, lumbar region: Secondary | ICD-10-CM | POA: Diagnosis not present

## 2018-08-28 DIAGNOSIS — M4126 Other idiopathic scoliosis, lumbar region: Secondary | ICD-10-CM

## 2018-08-28 DIAGNOSIS — Z713 Dietary counseling and surveillance: Secondary | ICD-10-CM | POA: Diagnosis not present

## 2018-08-28 DIAGNOSIS — M51369 Other intervertebral disc degeneration, lumbar region without mention of lumbar back pain or lower extremity pain: Secondary | ICD-10-CM

## 2018-08-28 DIAGNOSIS — E6609 Other obesity due to excess calories: Secondary | ICD-10-CM | POA: Diagnosis not present

## 2018-08-28 DIAGNOSIS — Z6832 Body mass index (BMI) 32.0-32.9, adult: Secondary | ICD-10-CM

## 2018-08-28 DIAGNOSIS — N6320 Unspecified lump in the left breast, unspecified quadrant: Secondary | ICD-10-CM

## 2018-08-28 DIAGNOSIS — K219 Gastro-esophageal reflux disease without esophagitis: Secondary | ICD-10-CM

## 2018-08-28 MED ORDER — ESOMEPRAZOLE MAGNESIUM 40 MG PO CPDR: 40.0000 mg | DELAYED_RELEASE_CAPSULE | Freq: Every day | ORAL | 1 refills | Status: DC

## 2018-08-28 MED ORDER — PHENTERMINE HCL 37.5 MG PO TABS: 37.5000 mg | ORAL_TABLET | Freq: Every day | ORAL | 2 refills | Status: DC

## 2018-08-28 MED ORDER — GABAPENTIN 300 MG PO CAPS: 300.0000 mg | ORAL_CAPSULE | Freq: Every day | ORAL | 3 refills | Status: DC

## 2018-08-28 NOTE — Progress Notes (Signed)
Patient: Dawn Summers Female    DOB: [birth year]   53 y.o.   MRN: [id number] Visit Date: 08/28/2018  Today's Provider: Mar Daring, PA-C   Chief Complaint  Patient presents with  . Follow-up   Subjective:     HPI  Follow up for elevated blood pressure  The patient was last seen for this 4 weeks ago. Changes made at last visit include check labs. Patient advised to monitor blood pressure at home.   BP Readings from Last 3 Encounters:  08/28/18 133/89  07/31/18 (!) 146/92  06/05/18 138/85   Wt Readings from Last 3 Encounters:  08/28/18 197 lb (89.4 kg)  07/31/18 191 lb 9.6 oz (86.9 kg)  06/05/18 188 lb (85.3 kg)   ------------------------------------------------------------------------------------  Follow up for left hip pain  The patient was last seen for this 4 weeks ago. Changes made at last visit include start meloxicam.  She reports excellent compliance with treatment. She feels that condition is Unchanged. Worse with activity.  She is not having side effects.  Patient also wants to discuss weight gain. She is trying to eat a healthier diet, cut out sodas. Continues to gain weight. Also unable to exercise due to back and left leg pain. ------------------------------------------------------------------------------------    Allergies  Allergen Reactions  . Latex     Other reaction(s): Unknown  . Penicillins Hives  . Sulfa Antibiotics Nausea And Vomiting  . Trazodone And Nefazodone Nausea Only and Other (See Comments)    dizziness     Current Outpatient Medications:  .  esomeprazole (NEXIUM) 40 MG capsule, Take 1 capsule (40 mg total) by mouth daily., Disp: 90 capsule, Rfl: 1  Review of Systems  Constitutional: Negative.   Respiratory: Negative for cough, chest tightness, shortness of breath and wheezing.   Cardiovascular: Negative for chest pain, palpitations and leg swelling.  Gastrointestinal: Negative.   Musculoskeletal: Positive  for arthralgias, back pain, gait problem and myalgias.  Neurological: Positive for numbness (and tingling down left leg). Negative for weakness.    Social History   Tobacco Use  . Smoking status: Former Smoker    Last attempt to quit: 11/22/2004    Years since quitting: 13.7  . Smokeless tobacco: Never Used  Substance Use Topics  . Alcohol use: Yes    Alcohol/week: 0.0 standard drinks      Objective:   BP 133/89 (BP Location: Left Arm, Patient Position: Sitting, Cuff Size: Large)   Pulse 67   Temp (!) 97.2 F (36.2 C) (Oral)   Resp 16   Ht 5\' 5"  (1.651 m)   Wt 197 lb (89.4 kg)   SpO2 97%   BMI 32.78 kg/m  Vitals:   08/28/18 1430  BP: 133/89  Pulse: 67  Resp: 16  Temp: (!) 97.2 F (36.2 C)  TempSrc: Oral  SpO2: 97%  Weight: 197 lb (89.4 kg)  Height: 5\' 5"  (1.651 m)     Physical Exam Vitals signs reviewed.  Constitutional:      General: She is not in acute distress.    Appearance: Normal appearance. She is well-developed. She is obese. She is not ill-appearing or diaphoretic.  Neck:     Musculoskeletal: Normal range of motion and neck supple.     Thyroid: No thyromegaly.     Vascular: No JVD.     Trachea: No tracheal deviation.  Cardiovascular:     Rate and Rhythm: Normal rate and regular rhythm.     Heart sounds:  Normal heart sounds. No murmur. No friction rub. No gallop.   Pulmonary:     Effort: Pulmonary effort is normal. No respiratory distress.     Breath sounds: Normal breath sounds. No wheezing or rales.  Musculoskeletal:     Left hip: She exhibits decreased strength. She exhibits normal range of motion and no tenderness.     Lumbar back: She exhibits decreased range of motion and tenderness. She exhibits no bony tenderness and no spasm.  Lymphadenopathy:     Cervical: No cervical adenopathy.  Neurological:     Mental Status: She is alert.        Assessment & Plan    1. DDD (degenerative disc disease), lumbar Since patient continues to have  nerve pain I will start gabapentin 300mg  at bedtime. I will see her back in 4-8 weeks.  - gabapentin (NEURONTIN) 300 MG capsule; Take 1 capsule (300 mg total) by mouth at bedtime.  Dispense: 90 capsule; Refill: 3  2. Other idiopathic scoliosis, lumbar region See above medical treatment plan. - gabapentin (NEURONTIN) 300 MG capsule; Take 1 capsule (300 mg total) by mouth at bedtime.  Dispense: 90 capsule; Refill: 3  3. Class 1 obesity due to excess calories with serious comorbidity and body mass index (BMI) of 32.0 to 32.9 in adult Will start phentermine as below. Follow up in 3 months for weight recheck.  - phentermine (ADIPEX-P) 37.5 MG tablet; Take 1 tablet (37.5 mg total) by mouth daily before breakfast.  Dispense: 30 tablet; Refill: 2  4. Encounter for weight loss counseling See above medical treatment plan. - phentermine (ADIPEX-P) 37.5 MG tablet; Take 1 tablet (37.5 mg total) by mouth daily before breakfast.  Dispense: 30 tablet; Refill: 2  5. Gastroesophageal reflux disease without esophagitis Stable. Diagnosis pulled for medication refill. Continue current medical treatment plan. - esomeprazole (NEXIUM) 40 MG capsule; Take 1 capsule (40 mg total) by mouth daily.  Dispense: 90 capsule; Refill: [place], PA-C  [facility]

## 2018-08-28 NOTE — Patient Instructions (Signed)
Gabapentin capsules or tablets What is this medicine? GABAPENTIN (GA ba pen tin) is used to control seizures in certain types of epilepsy. It is also used to treat certain types of nerve pain. This medicine may be used for other purposes; ask your health care provider or pharmacist if you have questions. COMMON BRAND NAME(S): Active-PAC with Gabapentin, Gabarone, Neurontin What should I tell my health care provider before I take this medicine? They need to know if you have any of these conditions: -kidney disease -suicidal thoughts, plans, or attempt; a previous suicide attempt by you or a family member -an unusual or allergic reaction to gabapentin, other medicines, foods, dyes, or preservatives -pregnant or trying to get pregnant -breast-feeding How should I use this medicine? Take this medicine by mouth with a glass of water. Follow the directions on the prescription label. You can take it with or without food. If it upsets your stomach, take it with food. Take your medicine at regular intervals. Do not take it more often than directed. Do not stop taking except on your doctor's advice. If you are directed to break the 600 or 800 mg tablets in half as part of your dose, the extra half tablet should be used for the next dose. If you have not used the extra half tablet within 28 days, it should be thrown away. A special MedGuide will be given to you by the pharmacist with each prescription and refill. Be sure to read this information carefully each time. Talk to your pediatrician regarding the use of this medicine in children. While this drug may be prescribed for children as young as 3 years for selected conditions, precautions do apply. Overdosage: If you think you have taken too much of this medicine contact a poison control center or emergency room at once. NOTE: This medicine is only for you. Do not share this medicine with others. What if I miss a dose? If you miss a dose, take it as soon  as you can. If it is almost time for your next dose, take only that dose. Do not take double or extra doses. What may interact with this medicine? Do not take this medicine with any of the following medications: -other gabapentin products This medicine may also interact with the following medications: -alcohol -antacids -antihistamines for allergy, cough and cold -certain medicines for anxiety or sleep -certain medicines for depression or psychotic disturbances -homatropine; hydrocodone -naproxen -narcotic medicines (opiates) for pain -phenothiazines like chlorpromazine, mesoridazine, prochlorperazine, thioridazine This list may not describe all possible interactions. Give your health care provider a list of all the medicines, herbs, non-prescription drugs, or dietary supplements you use. Also tell them if you smoke, drink alcohol, or use illegal drugs. Some items may interact with your medicine. What should I watch for while using this medicine? Visit your doctor or health care professional for regular checks on your progress. You may want to keep a record at home of how you feel your condition is responding to treatment. You may want to share this information with your doctor or health care professional at each visit. You should contact your doctor or health care professional if your seizures get worse or if you have any new types of seizures. Do not stop taking this medicine or any of your seizure medicines unless instructed by your doctor or health care professional. Stopping your medicine suddenly can increase your seizures or their severity. Wear a medical identification bracelet or chain if you are taking this medicine for   seizures, and carry a card that lists all your medications. You may get drowsy, dizzy, or have blurred vision. Do not drive, use machinery, or do anything that needs mental alertness until you know how this medicine affects you. To reduce dizzy or fainting spells, do not  sit or stand up quickly, especially if you are an older patient. Alcohol can increase drowsiness and dizziness. Avoid alcoholic drinks. Your mouth may get dry. Chewing sugarless gum or sucking hard candy, and drinking plenty of water will help. The use of this medicine may increase the chance of suicidal thoughts or actions. Pay special attention to how you are responding while on this medicine. Any worsening of mood, or thoughts of suicide or dying should be reported to your health care professional right away. Women who become pregnant while using this medicine may enroll in the [place] Antiepileptic Drug Pregnancy Registry by calling 1-888-233-2334. This registry collects information about the safety of antiepileptic drug use during pregnancy. What side effects may I notice from receiving this medicine? Side effects that you should report to your doctor or health care professional as soon as possible: -allergic reactions like skin rash, itching or hives, swelling of the face, lips, or tongue -worsening of mood, thoughts or actions of suicide or dying Side effects that usually do not require medical attention (report to your doctor or health care professional if they continue or are bothersome): -constipation -difficulty walking or controlling muscle movements -dizziness -nausea -slurred speech -tiredness -tremors -weight gain This list may not describe all possible side effects. Call your doctor for medical advice about side effects. You may report side effects to FDA at 1-800-FDA-1088. Where should I keep my medicine? Keep out of reach of children. This medicine may cause accidental overdose and death if it taken by other adults, children, or pets. Mix any unused medicine with a substance like cat litter or coffee grounds. Then throw the medicine away in a sealed container like a sealed bag or a coffee can with a lid. Do not use the medicine after the expiration date. Store at room  temperature between 15 and 30 degrees C (59 and 86 degrees F). NOTE: This sheet is a summary. It may not cover all possible information. If you have questions about this medicine, talk to your doctor, pharmacist, or health care provider.  2019 Elsevier/Gold Standard (2017-11-13 13:21:44)  

## 2018-08-31 ENCOUNTER — Encounter: Payer: Self-pay | Admitting: Physician Assistant

## 2018-11-02 ENCOUNTER — Ambulatory Visit: Payer: BLUE CROSS/BLUE SHIELD | Admitting: Urology

## 2018-11-23 ENCOUNTER — Ambulatory Visit: Payer: BLUE CROSS/BLUE SHIELD | Admitting: Urology

## 2018-12-07 ENCOUNTER — Ambulatory Visit: Payer: Self-pay | Admitting: Urology

## 2018-12-10 ENCOUNTER — Telehealth: Payer: Self-pay | Admitting: Urology

## 2018-12-10 NOTE — Telephone Encounter (Signed)
Pt called and states that her new insurance is out of network with [place]. She wants to go to [facility] urology, but they need a referral from our office to be sent in order for pt to be seen. Please advise.

## 2018-12-11 ENCOUNTER — Telehealth: Payer: Self-pay | Admitting: *Deleted

## 2018-12-11 DIAGNOSIS — N210 Calculus in bladder: Secondary | ICD-10-CM

## 2018-12-11 DIAGNOSIS — N200 Calculus of kidney: Secondary | ICD-10-CM

## 2018-12-11 NOTE — Telephone Encounter (Signed)
Per review Pt need Urology referral not Neurology referral. Referral placed for her

## 2018-12-11 NOTE — Telephone Encounter (Signed)
Patient was advised to contact her PCP for the new referral to [facility], I did let her know that [facility] was able to see all of our notes on care everywhere.   Sharyn Lull

## 2018-12-11 NOTE — Telephone Encounter (Signed)
Patient called office requesting a new referral to Neurology office in [place]. Patient states she needs new referral due to insurance changes. Please advise?

## 2018-12-22 ENCOUNTER — Telehealth: Payer: Self-pay | Admitting: *Deleted

## 2018-12-22 NOTE — Telephone Encounter (Signed)
Disregard message

## 2019-03-08 ENCOUNTER — Other Ambulatory Visit: Payer: Self-pay | Admitting: Physician Assistant

## 2019-03-08 DIAGNOSIS — K219 Gastro-esophageal reflux disease without esophagitis: Secondary | ICD-10-CM

## 2019-03-09 IMAGING — CR DG LUMBAR SPINE COMPLETE 4+V
1 series · 5 of 5 positions shown · non-contrast
Comparison: CT 09/24/2017

CLINICAL DATA: Back pain

EXAM:
LUMBAR SPINE - COMPLETE 4+ VIEW

[Series 1: dg lumbar spine complete 4 +v · 0.14mm/px · 5 of 5 slices shown]
[im 1/5]
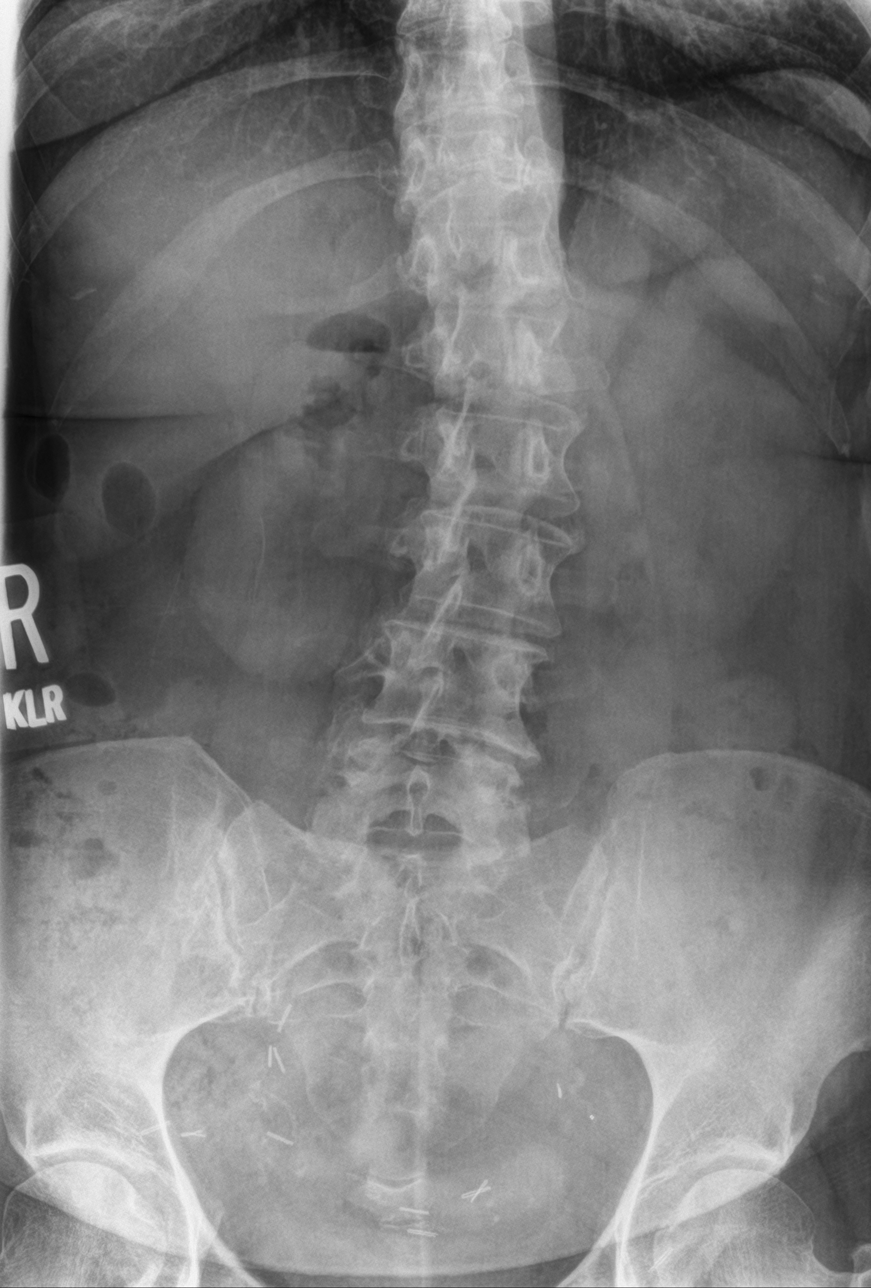
[im 2/5]
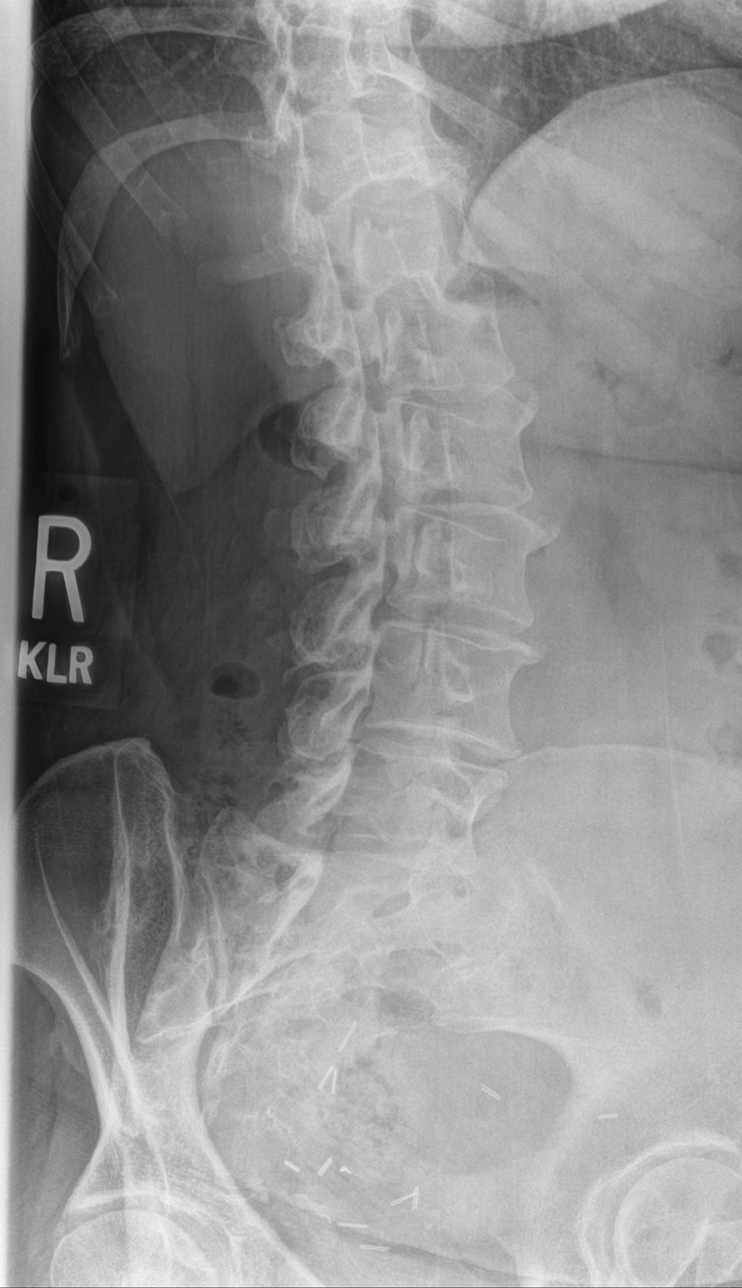
[im 3/5]
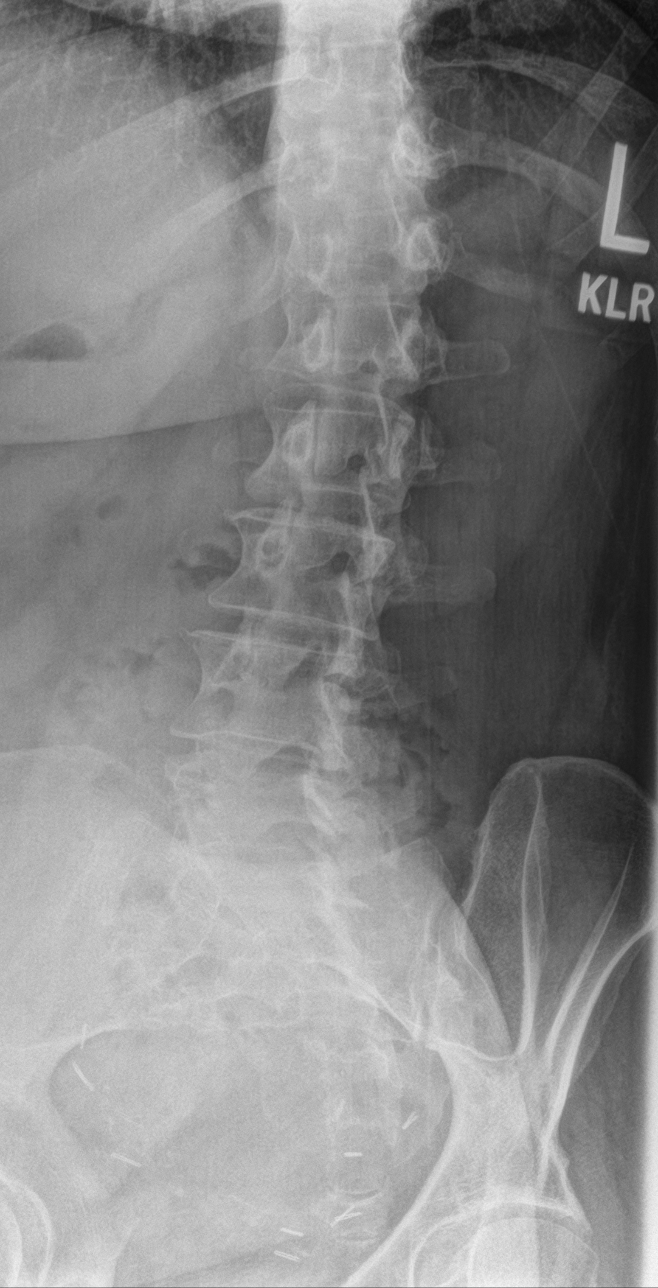
[im 4/5]
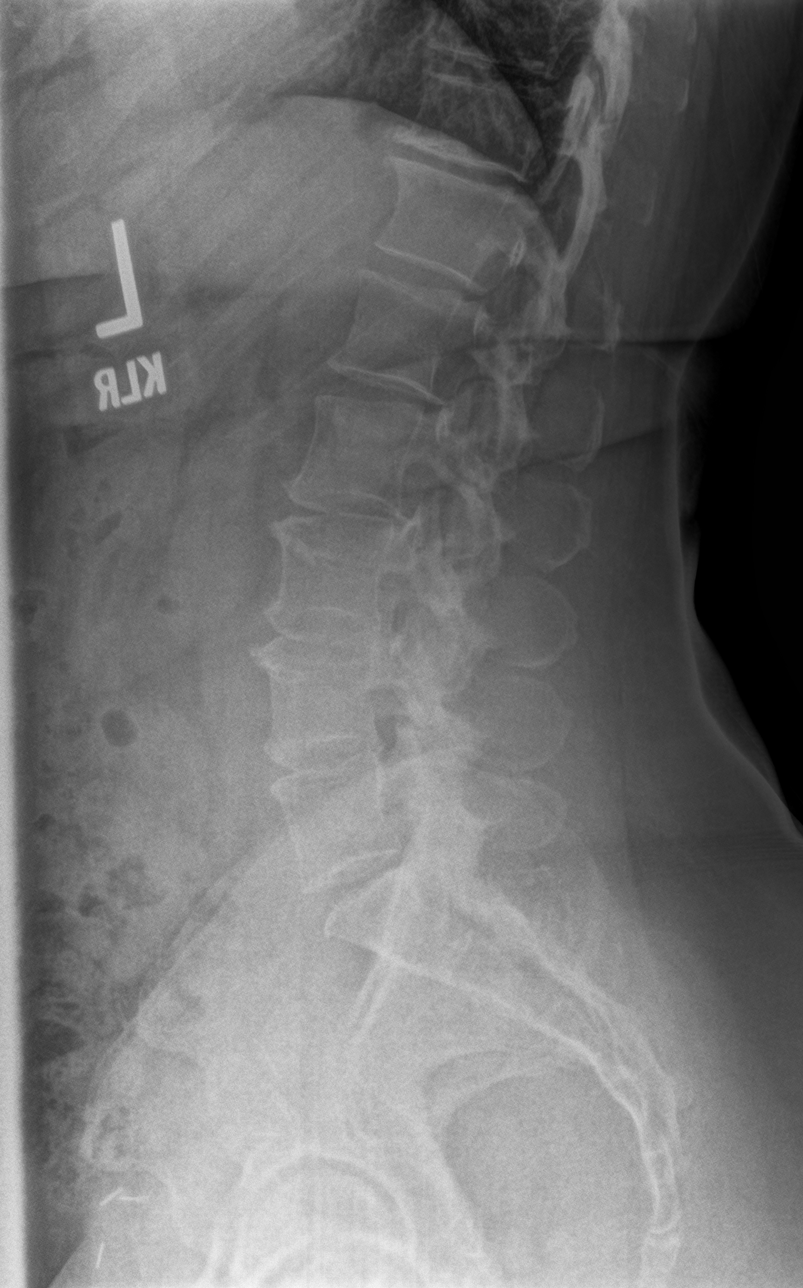
[im 5/5]
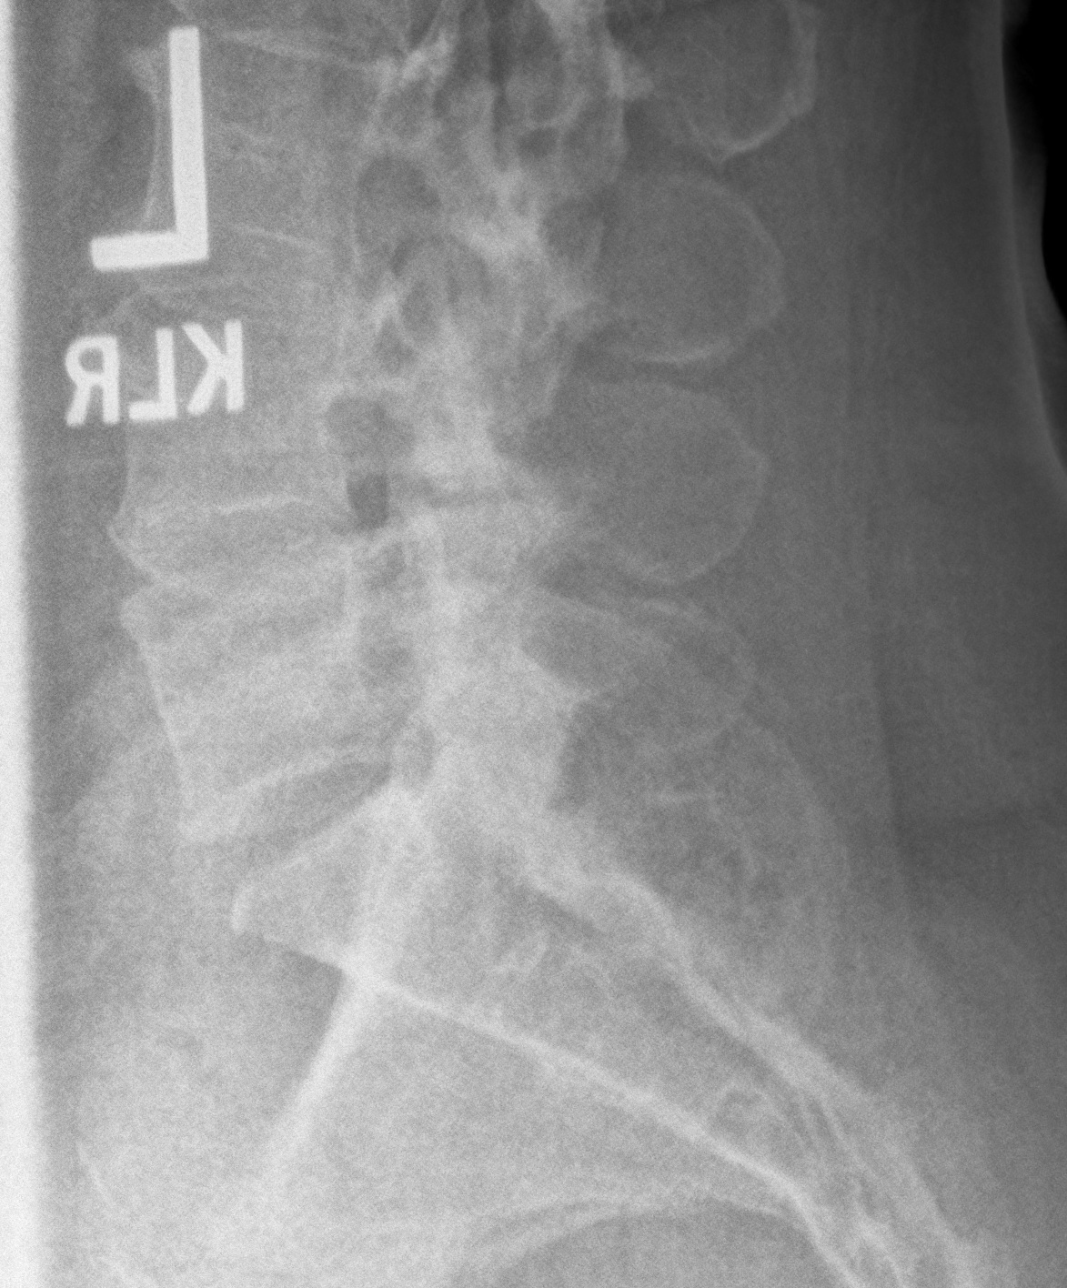

[5 of 5 positions shown; findings below may reference images not displayed]

FINDINGS: Leftward scoliosis. Diffuse degenerative facet disease. Mild
degenerative disc disease throughout the lumbar spine. No fracture
or subluxation. SI joints symmetric and unremarkable.
IMPRESSION: Leftward scoliosis with associated degenerative disc and facet
disease. No acute bony abnormality.

## 2019-06-16 ENCOUNTER — Other Ambulatory Visit: Payer: Self-pay | Admitting: Specialist

## 2019-06-17 ENCOUNTER — Other Ambulatory Visit: Payer: BLUE CROSS/BLUE SHIELD

## 2019-06-21 ENCOUNTER — Encounter
Admission: RE | Admit: 2019-06-21 | Discharge: 2019-06-21 | Disposition: A | Discharge status: Home / Self Care | Payer: BLUE CROSS/BLUE SHIELD | Source: Ambulatory Visit | Attending: Specialist | Admitting: Specialist

## 2019-06-21 ENCOUNTER — Other Ambulatory Visit: Payer: Self-pay

## 2019-06-21 ENCOUNTER — Other Ambulatory Visit
Admission: RE | Admit: 2019-06-21 | Discharge: 2019-06-21 | Disposition: A | Discharge status: Home / Self Care | Payer: BLUE CROSS/BLUE SHIELD | Source: Ambulatory Visit | Attending: Specialist | Admitting: Specialist

## 2019-06-21 DIAGNOSIS — Z01812 Encounter for preprocedural laboratory examination: Secondary | ICD-10-CM | POA: Diagnosis present

## 2019-06-21 DIAGNOSIS — Z20828 Contact with and (suspected) exposure to other viral communicable diseases: Secondary | ICD-10-CM | POA: Diagnosis not present

## 2019-06-21 HISTORY — DX: Headache, unspecified: R51.9

## 2019-06-21 HISTORY — DX: Urethral diverticulum: N36.1

## 2019-06-21 HISTORY — DX: Local infection of the skin and subcutaneous tissue, unspecified: L08.9

## 2019-06-21 HISTORY — DX: Personal history of urinary calculi: Z87.442

## 2019-06-21 HISTORY — DX: Calculus of kidney: N20.0

## 2019-06-21 HISTORY — DX: Anxiety disorder, unspecified: F41.9

## 2019-06-21 HISTORY — DX: Other complications of anesthesia, initial encounter: T88.59XA

## 2019-06-21 HISTORY — DX: Dorsalgia, unspecified: M54.9

## 2019-06-21 LAB — SARS CORONAVIRUS 2 (TAT 6-24 HRS): SARS Coronavirus 2: NEGATIVE

## 2019-06-21 MED ORDER — CLINDAMYCIN PHOSPHATE 600 MG/50ML IV SOLN
600.0000 mg | INTRAVENOUS | Status: AC
  Administered 2019-06-22: 08:00:00 600 mg via INTRAVENOUS

## 2019-06-21 MED ORDER — CEFAZOLIN SODIUM-DEXTROSE 2-4 GM/100ML-% IV SOLN
2.0000 g | INTRAVENOUS | Status: AC
  Administered 2019-06-22: 2 g via INTRAVENOUS

## 2019-06-21 NOTE — Patient Instructions (Signed)
Your procedure is scheduled on: 06/22/2019 Tues Report to Same Day Surgery 2nd floor medical mall [facility] Entrance-take elevator on left to 2nd floor.  Check in with surgery information desk.) Arrival time 6:15 am  Remember: Instructions that are not followed completely may result in serious medical risk, up to and including death, or upon the discretion of your surgeon and anesthesiologist your surgery may need to be rescheduled.    _x___ 1. Do not eat food after midnight the night before your procedure. You may drink clear liquids up to 2 hours before you are scheduled to arrive at the hospital for your procedure.  Do not drink clear liquids within 2 hours of your scheduled arrival to the hospital.  Clear liquids include  --Water or Apple juice without pulp  --Clear carbohydrate beverage such as ClearFast or Gatorade  --Black Coffee or Clear Tea (No milk, no creamers, do not add anything to                  the coffee or Tea Type 1 and type 2 diabetics should only drink water.   ____Ensure clear carbohydrate drink on the way to the hospital for bariatric patients  ____Ensure clear carbohydrate drink 3 hours before surgery.   No gum chewing or hard candies.     __x__ 2. No Alcohol for 24 hours before or after surgery.   __x__3. No Smoking or e-cigarettes for 24 prior to surgery.  Do not use any chewable tobacco products for at least 6 hour prior to surgery   ____  4. Bring all medications with you on the day of surgery if instructed.    __x__ 5. Notify your doctor if there is any change in your medical condition     (cold, fever, infections).    x___6. On the morning of surgery brush your teeth with toothpaste and water.  You may rinse your mouth with mouth wash if you wish.  Do not swallow any toothpaste or mouthwash.   Do not wear jewelry, make-up, hairpins, clips or nail polish.  Do not wear lotions, powders, or perfumes. You may wear deodorant.  Do not shave 48 hours  prior to surgery. Men may shave face and neck.  Do not bring valuables to the hospital.    [facility] is not responsible for any belongings or valuables.               Contacts, dentures or bridgework may not be worn into surgery.  Leave your suitcase in the car. After surgery it may be brought to your room.  For patients admitted to the hospital, discharge time is determined by your                       treatment team.  _  Patients discharged the day of surgery will not be allowed to drive home.  You will need someone to drive you home and stay with you the night of your procedure.    Please read over the following fact sheets that you were given:   [facility] Preparing for Surgery and or MRSA Information   _x___ Take anti-hypertensive listed below, cardiac, seizure, asthma,     anti-reflux and psychiatric medicines. These include:  1. esomeprazole (NEXIUM) 40 MG capsule  2.  3.  4.  5.  6.  ____Fleets enema or Magnesium Citrate as directed.   _x___ Use CHG Soap or sage wipes as directed on instruction sheet  ____ Use inhalers on the day of surgery and bring to hospital day of surgery  ____ Stop Metformin and Janumet 2 days prior to surgery.    ____ Take 1/2 of usual insulin dose the night before surgery and none on the morning     surgery.   _x___ Follow recommendations from Cardiologist, Pulmonologist or PCP regarding          stopping Aspirin, Coumadin, Plavix ,Eliquis, Effient, or Pradaxa, and Pletal.  X____Stop Anti-inflammatories such as Advil, Aleve, Ibuprofen, Motrin, Naproxen, Naprosyn, Goodies powders or aspirin products. OK to take Tylenol and                          Celebrex.   _x___ Stop supplements until after surgery.  But may continue Vitamin D, Vitamin B,       and multivitamin.   ____ Bring C-Pap to the hospital.

## 2019-06-22 ENCOUNTER — Other Ambulatory Visit: Payer: Self-pay

## 2019-06-22 ENCOUNTER — Encounter
Admission: RE | Disposition: A | Discharge status: Home / Self Care | Payer: Self-pay | Source: Home / Self Care | Attending: Specialist

## 2019-06-22 ENCOUNTER — Ambulatory Visit: Payer: BLUE CROSS/BLUE SHIELD | Admitting: Anesthesiology

## 2019-06-22 ENCOUNTER — Ambulatory Visit
Admission: RE | Admit: 2019-06-22 | Discharge: 2019-06-22 | Disposition: A | Discharge status: Home / Self Care | Payer: BLUE CROSS/BLUE SHIELD | Attending: Specialist | Admitting: Specialist

## 2019-06-22 ENCOUNTER — Encounter: Payer: Self-pay | Admitting: Specialist

## 2019-06-22 DIAGNOSIS — E785 Hyperlipidemia, unspecified: Secondary | ICD-10-CM | POA: Insufficient documentation

## 2019-06-22 DIAGNOSIS — G4730 Sleep apnea, unspecified: Secondary | ICD-10-CM | POA: Insufficient documentation

## 2019-06-22 DIAGNOSIS — Z8541 Personal history of malignant neoplasm of cervix uteri: Secondary | ICD-10-CM | POA: Diagnosis not present

## 2019-06-22 DIAGNOSIS — Z79899 Other long term (current) drug therapy: Secondary | ICD-10-CM | POA: Insufficient documentation

## 2019-06-22 DIAGNOSIS — K219 Gastro-esophageal reflux disease without esophagitis: Secondary | ICD-10-CM | POA: Insufficient documentation

## 2019-06-22 DIAGNOSIS — M795 Residual foreign body in soft tissue: Secondary | ICD-10-CM | POA: Diagnosis not present

## 2019-06-22 DIAGNOSIS — E7800 Pure hypercholesterolemia, unspecified: Secondary | ICD-10-CM | POA: Diagnosis not present

## 2019-06-22 DIAGNOSIS — Z01810 Encounter for preprocedural cardiovascular examination: Secondary | ICD-10-CM

## 2019-06-22 DIAGNOSIS — F17200 Nicotine dependence, unspecified, uncomplicated: Secondary | ICD-10-CM | POA: Diagnosis not present

## 2019-06-22 DIAGNOSIS — L98499 Non-pressure chronic ulcer of skin of other sites with unspecified severity: Secondary | ICD-10-CM | POA: Diagnosis not present

## 2019-06-22 DIAGNOSIS — L02511 Cutaneous abscess of right hand: Secondary | ICD-10-CM | POA: Diagnosis not present

## 2019-06-22 HISTORY — PX: I & D EXTREMITY: SHX5045

## 2019-06-22 HISTORY — DX: Family history of other specified conditions: Z84.89

## 2019-06-22 LAB — BASIC METABOLIC PANEL
Anion gap: 10 (ref 5–15)
BUN: 22 mg/dL — ABNORMAL HIGH (ref 6–20)
CO2: 28 mmol/L (ref 22–32)
Calcium: 9 mg/dL (ref 8.9–10.3)
Chloride: 103 mmol/L (ref 98–111)
Creatinine, Ser: 0.8 mg/dL (ref 0.44–1.00)
GFR calc Af Amer: >60 mL/min (ref >60)
GFR calc non Af Amer: >60 mL/min (ref >60)
Glucose, Bld: 102 mg/dL — ABNORMAL HIGH (ref 70–99)
Potassium: 4.1 mmol/L (ref 3.5–5.1)
Sodium: 141 mmol/L (ref 135–145)

## 2019-06-22 LAB — CBC
HCT: 43.4 % (ref 36.0–46.0)
Hemoglobin: 14.2 g/dL (ref 12.0–15.0)
MCH: 29.8 pg (ref 26.0–34.0)
MCHC: 32.7 g/dL (ref 30.0–36.0)
MCV: 91 fL (ref 80.0–100.0)
Platelets: 288 10*3/uL (ref 150–400)
RBC: 4.77 MIL/uL (ref 3.87–5.11)
RDW: 13.6 % (ref 11.5–15.5)
WBC: 7 10*3/uL (ref 4.0–10.5)
nRBC: 0 % (ref 0.0–0.2)

## 2019-06-22 SURGERY — IRRIGATION AND DEBRIDEMENT EXTREMITY
Anesthesia: General | Laterality: Right

## 2019-06-22 MED ORDER — GLYCOPYRROLATE 0.2 MG/ML IJ SOLN
INTRAMUSCULAR | Status: DC | PRN
  Administered 2019-06-22: .2 mg via INTRAVENOUS

## 2019-06-22 MED ORDER — GABAPENTIN 300 MG PO CAPS: 300.0000 mg | ORAL_CAPSULE | ORAL | Status: AC

## 2019-06-22 MED ORDER — BUPIVACAINE HCL (PF) 0.5 % IJ SOLN
INTRAMUSCULAR | Status: AC
  Filled 2019-06-22: qty 30

## 2019-06-22 MED ORDER — EPHEDRINE SULFATE 50 MG/ML IJ SOLN
INTRAMUSCULAR | Status: DC | PRN
  Administered 2019-06-22: 5 mg via INTRAVENOUS

## 2019-06-22 MED ORDER — HYDROCODONE-ACETAMINOPHEN 5-325 MG PO TABS: 1.0000 | ORAL_TABLET | Freq: Four times a day (QID) | ORAL | 0 refills | Status: DC | PRN

## 2019-06-22 MED ORDER — GABAPENTIN 300 MG PO CAPS
ORAL_CAPSULE | ORAL | Status: AC
  Administered 2019-06-22: 300 mg via ORAL
  Filled 2019-06-22: qty 1

## 2019-06-22 MED ORDER — GLYCOPYRROLATE 0.2 MG/ML IJ SOLN
INTRAMUSCULAR | Status: AC
  Filled 2019-06-22: qty 1

## 2019-06-22 MED ORDER — FENTANYL CITRATE (PF) 100 MCG/2ML IJ SOLN
INTRAMUSCULAR | Status: DC | PRN
  Administered 2019-06-22: 10 ug via INTRAVENOUS
  Administered 2019-06-22: 15 ug via INTRAVENOUS
  Administered 2019-06-22 (×3): 25 ug via INTRAVENOUS

## 2019-06-22 MED ORDER — PHENYLEPHRINE HCL (PRESSORS) 10 MG/ML IV SOLN
INTRAVENOUS | Status: AC
  Filled 2019-06-22: qty 1

## 2019-06-22 MED ORDER — BUPIVACAINE HCL (PF) 0.5 % IJ SOLN
INTRAMUSCULAR | Status: DC | PRN
  Administered 2019-06-22: 15 mL

## 2019-06-22 MED ORDER — PHENYLEPHRINE HCL (PRESSORS) 10 MG/ML IV SOLN
INTRAVENOUS | Status: DC | PRN
  Administered 2019-06-22 (×3): 100 ug via INTRAVENOUS
  Administered 2019-06-22: 200 ug via INTRAVENOUS

## 2019-06-22 MED ORDER — LACTATED RINGERS IV SOLN: INTRAVENOUS | Status: DC

## 2019-06-22 MED ORDER — DEXAMETHASONE SODIUM PHOSPHATE 10 MG/ML IJ SOLN
INTRAMUSCULAR | Status: AC
  Filled 2019-06-22: qty 1

## 2019-06-22 MED ORDER — GABAPENTIN 400 MG PO CAPS: 400.0000 mg | ORAL_CAPSULE | Freq: Three times a day (TID) | ORAL | 3 refills | Status: DC

## 2019-06-22 MED ORDER — NEOMYCIN-POLYMYXIN B GU 40-200000 IR SOLN
Status: DC | PRN
  Administered 2019-06-22: 2 mL

## 2019-06-22 MED ORDER — CEFAZOLIN SODIUM-DEXTROSE 2-4 GM/100ML-% IV SOLN
INTRAVENOUS | Status: AC
  Filled 2019-06-22: qty 100

## 2019-06-22 MED ORDER — ONDANSETRON HCL 4 MG/2ML IJ SOLN
INTRAMUSCULAR | Status: DC | PRN
  Administered 2019-06-22 (×2): 4 mg via INTRAVENOUS

## 2019-06-22 MED ORDER — PROPOFOL 10 MG/ML IV BOLUS
INTRAVENOUS | Status: AC
  Filled 2019-06-22: qty 40

## 2019-06-22 MED ORDER — NEOMYCIN-POLYMYXIN B GU 40-200000 IR SOLN
Status: AC
  Filled 2019-06-22: qty 20

## 2019-06-22 MED ORDER — LIDOCAINE HCL (CARDIAC) PF 100 MG/5ML IV SOSY
PREFILLED_SYRINGE | INTRAVENOUS | Status: DC | PRN
  Administered 2019-06-22: 100 mg via INTRAVENOUS

## 2019-06-22 MED ORDER — PROPOFOL 10 MG/ML IV BOLUS
INTRAVENOUS | Status: DC | PRN
  Administered 2019-06-22: 30 mg via INTRAVENOUS
  Administered 2019-06-22: 170 mg via INTRAVENOUS

## 2019-06-22 MED ORDER — CEPHALEXIN 500 MG PO CAPS: 500.0000 mg | ORAL_CAPSULE | Freq: Three times a day (TID) | ORAL | 3 refills | Status: DC

## 2019-06-22 MED ORDER — CHLORHEXIDINE GLUCONATE CLOTH 2 % EX PADS
6.0000 | MEDICATED_PAD | Freq: Once | CUTANEOUS | Status: AC
  Administered 2019-06-22: 07:00:00 6 via TOPICAL

## 2019-06-22 MED ORDER — SUCCINYLCHOLINE CHLORIDE 20 MG/ML IJ SOLN
INTRAMUSCULAR | Status: AC
  Filled 2019-06-22: qty 1

## 2019-06-22 MED ORDER — EPHEDRINE SULFATE 50 MG/ML IJ SOLN
INTRAMUSCULAR | Status: AC
  Filled 2019-06-22: qty 1

## 2019-06-22 MED ORDER — MELOXICAM 7.5 MG PO TABS: 15.0000 mg | ORAL_TABLET | ORAL | Status: AC

## 2019-06-22 MED ORDER — ONDANSETRON HCL 4 MG/2ML IJ SOLN
INTRAMUSCULAR | Status: AC
  Filled 2019-06-22: qty 4

## 2019-06-22 MED ORDER — DEXAMETHASONE SODIUM PHOSPHATE 10 MG/ML IJ SOLN
INTRAMUSCULAR | Status: DC | PRN
  Administered 2019-06-22: 10 mg via INTRAVENOUS

## 2019-06-22 MED ORDER — FENTANYL CITRATE (PF) 100 MCG/2ML IJ SOLN
INTRAMUSCULAR | Status: AC
  Filled 2019-06-22: qty 2

## 2019-06-22 MED ORDER — MIDAZOLAM HCL 2 MG/2ML IJ SOLN
INTRAMUSCULAR | Status: AC
  Filled 2019-06-22: qty 2

## 2019-06-22 MED ORDER — MIDAZOLAM HCL 2 MG/2ML IJ SOLN
INTRAMUSCULAR | Status: DC | PRN
  Administered 2019-06-22 (×2): 2 mg via INTRAVENOUS

## 2019-06-22 MED ORDER — ROCURONIUM BROMIDE 50 MG/5ML IV SOLN
INTRAVENOUS | Status: AC
  Filled 2019-06-22: qty 1

## 2019-06-22 MED ORDER — MELOXICAM 15 MG PO TABS: 15.0000 mg | ORAL_TABLET | Freq: Every day | ORAL | 3 refills | Status: DC

## 2019-06-22 MED ORDER — MELOXICAM 7.5 MG PO TABS
ORAL_TABLET | ORAL | Status: AC
  Administered 2019-06-22: 15 mg via ORAL
  Filled 2019-06-22: qty 2

## 2019-06-22 MED ORDER — LIDOCAINE HCL (PF) 2 % IJ SOLN
INTRAMUSCULAR | Status: AC
  Filled 2019-06-22: qty 20

## 2019-06-22 MED ORDER — CLINDAMYCIN PHOSPHATE 600 MG/50ML IV SOLN
INTRAVENOUS | Status: AC
  Filled 2019-06-22: qty 50

## 2019-06-22 SURGICAL SUPPLY — 32 items
BLADE SURG MINI STRL (BLADE) ×2 IMPLANT
BNDG CONFORM 3 STRL LF (GAUZE/BANDAGES/DRESSINGS) ×4 IMPLANT
BNDG ELASTIC 3X5.8 VLCR NS LF (GAUZE/BANDAGES/DRESSINGS) IMPLANT
BNDG ESMARK 4X12 TAN STRL LF (GAUZE/BANDAGES/DRESSINGS) ×2 IMPLANT
CANISTER SUCT 1200ML W/VALVE (MISCELLANEOUS) ×2 IMPLANT
CAST PADDING 3X4FT ST 30246 (SOFTGOODS)
CHLORAPREP W/TINT 26 (MISCELLANEOUS) ×2 IMPLANT
COVER WAND RF STERILE (DRAPES) ×2 IMPLANT
CUFF TOURN SGL QUICK 18X4 (TOURNIQUET CUFF) ×2 IMPLANT
CUFF TOURN SGL QUICK 24 (TOURNIQUET CUFF)
CUFF TRNQT CYL 24X4X16.5-23 (TOURNIQUET CUFF) IMPLANT
DRAPE STERI IOBAN 125X83 (DRAPES) ×2 IMPLANT
DRSG GAUZE FLUFF 36X18 (GAUZE/BANDAGES/DRESSINGS) ×2 IMPLANT
ELECT REM PT RETURN 9FT ADLT (ELECTROSURGICAL) ×2
ELECTRODE REM PT RTRN 9FT ADLT (ELECTROSURGICAL) ×1 IMPLANT
GAUZE SPONGE 4X4 12PLY STRL (GAUZE/BANDAGES/DRESSINGS) IMPLANT
GAUZE XEROFORM 1X8 LF (GAUZE/BANDAGES/DRESSINGS) ×2 IMPLANT
GLOVE BIO SURGEON STRL SZ8 (GLOVE) ×2 IMPLANT
GOWN STRL REUS W/ TWL LRG LVL3 (GOWN DISPOSABLE) ×1 IMPLANT
GOWN STRL REUS W/TWL LRG LVL3 (GOWN DISPOSABLE) ×1
GOWN STRL REUS W/TWL LRG LVL4 (GOWN DISPOSABLE) ×2 IMPLANT
KIT TURNOVER KIT A (KITS) ×2 IMPLANT
LOOP RED MAXI  1X406MM (MISCELLANEOUS) ×1
LOOP VESSEL MAXI 1X406 RED (MISCELLANEOUS) ×1 IMPLANT
NS IRRIG 500ML POUR BTL (IV SOLUTION) ×2 IMPLANT
PACK EXTREMITY ARMC (MISCELLANEOUS) ×2 IMPLANT
PAD CAST CTTN 3X4 STRL (SOFTGOODS) IMPLANT
PAD PREP 24X41 OB/GYN DISP (PERSONAL CARE ITEMS) ×2 IMPLANT
STOCKINETTE STRL 4IN 9604848 (GAUZE/BANDAGES/DRESSINGS) ×2 IMPLANT
SUT ETHILON 4 0 P 3 18 (SUTURE) ×2 IMPLANT
SUT ETHILON 5 0 CL P 3 (SUTURE) ×2 IMPLANT
SWAB CULTURE AMIES ANAERIB BLU (MISCELLANEOUS) ×2 IMPLANT

## 2019-06-22 NOTE — Transfer of Care (Signed)
Immediate Anesthesia Transfer of Care Note  Patient: Dawn Summers  Procedure(s) Performed: IRRIGATION AND DEBRIDEMENT RIGHT HAND FOREIGN BODY (Right )  Patient Location: PACU  Anesthesia Type:General  Level of Consciousness: awake, alert , oriented and patient cooperative  Airway & Oxygen Therapy: Patient Spontanous Breathing  Post-op Assessment: Report given to RN and Post -op Vital signs reviewed and stable  Post vital signs: Reviewed and stable  Last Vitals:  Vitals Value Taken Time  BP 119/71 06/22/19 0921  Temp    Pulse 93 06/22/19 0922  Resp    SpO2 96 % 06/22/19 0922  Vitals shown include unvalidated device data.  Last Pain:  Vitals:   06/22/19 0615  TempSrc: Tympanic  PainSc: 0-No pain         Complications: No apparent anesthesia complications

## 2019-06-22 NOTE — Anesthesia Post-op Follow-up Note (Signed)
Anesthesia QCDR form completed.        

## 2019-06-22 NOTE — OR Nursing (Signed)
Interviewed patient before surgery, addressed that latex is a listed allergy. Patient denied true latex allergy and that latex gloves are not an issue. Patient did not want this allergy removed from list on chart.

## 2019-06-22 NOTE — Anesthesia Procedure Notes (Signed)
Procedure Name: LMA Insertion Performed by: Kelton Pillar, CRNA Pre-anesthesia Checklist: Patient identified, Emergency Drugs available, Suction available and Patient being monitored Patient Re-evaluated:Patient Re-evaluated prior to induction Oxygen Delivery Method: Circle system utilized Preoxygenation: Pre-oxygenation with 100% oxygen Induction Type: IV induction LMA: LMA inserted LMA Size: 4.0 Number of attempts: 1 Placement Confirmation: CO2 detector,  breath sounds checked- equal and bilateral and positive ETCO2 Tube secured with: Tape Dental Injury: Teeth and Oropharynx as per pre-operative assessment

## 2019-06-22 NOTE — Discharge Instructions (Signed)

## 2019-06-22 NOTE — Op Note (Signed)
  06/22/2019  9:13 AM  PATIENT:  Dawn Summers  53 y.o. female  PRE-OPERATIVE DIAGNOSIS:  Right hand foreign body with abcess  POST-OPERATIVE DIAGNOSIS:  Same  PROCEDURE:  IRRIGATION AND DEBRIDEMENT RIGHT HAND.  FOREIGN BODY                            REMOVAL  SURGEON:  Kayo Zion E Celeste Candelas MD  ASST:    ANESTHESIA:   General  EBL: Minimal  TOURNIQUET TIME: 15 minutes  OPERATIVE FINDINGS: 2 fragments of hair deep in the third webspace dorsally with a small amount of pus present.  Chronic granulation tissue present.  OPERATIVE PROCEDURE:    The patient was brought to the operating room and underwent satisfactory general LMA anesthesia in the supine position.  The arm was prepped and draped in sterile fashion.  It was elevated and tourniquet inflated to 250 mmHg.  A longitudinal incision was made dorsally in the third webspace extending proximally A short distance.  An area of chronic skin ulceration was debrided.  Using loupe magnification dissection was carried out in this area and 2 small fragments of hair were identified and removed.  There was some creamy pus associated with this which was cultured.  There was chronic granulation tissue in the webspace dorsally which was removed through careful dissection.  Once the pathological tissue was removed the wound was irrigated.  The tourniquet was released with good return of blood flow to the hand and pressure was applied to the wound to stem bleeding.  Cautery was used to facilitate this. The wound was then irrigated thoroughly with GU irrigation.   A vessel loop drain was placed in the wound and the skin then loosely closed with  5-0 nylon sutures.  A soft dressing was applied. Patient was awakened and taken recovery in good condition.  Earnestine Leys, MD

## 2019-06-22 NOTE — OR Nursing (Signed)
Per Dr. Sabra Heck pt is okay to receive Ancef with allergy to penicillin

## 2019-06-22 NOTE — Anesthesia Postprocedure Evaluation (Signed)
Anesthesia Post Note  Patient: Dawn Summers  Procedure(s) Performed: IRRIGATION AND DEBRIDEMENT RIGHT HAND FOREIGN BODY (Right )  Patient location during evaluation: PACU Anesthesia Type: General Level of consciousness: awake and alert and oriented Pain management: pain level controlled Vital Signs Assessment: post-procedure vital signs reviewed and stable Respiratory status: spontaneous breathing, nonlabored ventilation and respiratory function stable Cardiovascular status: blood pressure returned to baseline and stable Postop Assessment: no signs of nausea or vomiting Anesthetic complications: no     Last Vitals:  Vitals:   06/22/19 1009 06/22/19 1031  BP: 121/73 123/76  Pulse: 81 71  Resp: 16 18  Temp: 36.4 C   SpO2: 96% 97%    Last Pain:  Vitals:   06/22/19 1031  TempSrc:   PainSc: 0-No pain                 Daisuke Bailey

## 2019-06-22 NOTE — H&P (Signed)
THE PATIENT WAS SEEN PRIOR TO SURGERY TODAY.  HISTORY, ALLERGIES, HOME MEDICATIONS AND OPERATIVE PROCEDURE WERE REVIEWED. RISKS AND BENEFITS OF SURGERY DISCUSSED WITH PATIENT AGAIN.  NO CHANGES FROM INITIAL HISTORY AND PHYSICAL NOTED.    

## 2019-06-22 NOTE — Anesthesia Preprocedure Evaluation (Signed)
Anesthesia Evaluation  Patient identified by MRN, date of birth, ID band Patient awake    Reviewed: Allergy & Precautions, NPO status , Patient's Chart, lab work & pertinent test results  History of Anesthesia Complications (+) PROLONGED EMERGENCE, Family history of anesthesia reaction and history of anesthetic complications  Airway Mallampati: III       Dental   Pulmonary sleep apnea (not using CPAP) , neg COPD, Current Smoker and Patient abstained from smoking.,           Cardiovascular (-) hypertension(-) Past MI and (-) CHF (-) dysrhythmias (-) Valvular Problems/Murmurs     Neuro/Psych neg Seizures Anxiety    GI/Hepatic Neg liver ROS, GERD  Medicated and Controlled,  Endo/Other  neg diabetes  Renal/GU Renal disease (stones)     Musculoskeletal   Abdominal   Peds  Hematology   Anesthesia Other Findings   Reproductive/Obstetrics                            Anesthesia Physical Anesthesia Plan  ASA: III  Anesthesia Plan: General   Post-op Pain Management:    Induction: Intravenous  PONV Risk Score and Plan: 2 and Dexamethasone and Ondansetron  Airway Management Planned: LMA  Additional Equipment:   Intra-op Plan:   Post-operative Plan:   Informed Consent: I have reviewed the patients History and Physical, chart, labs and discussed the procedure including the risks, benefits and alternatives for the proposed anesthesia with the patient or authorized representative who has indicated his/her understanding and acceptance.       Plan Discussed with:   Anesthesia Plan Comments:         Anesthesia Quick Evaluation

## 2019-06-23 LAB — SURGICAL PATHOLOGY

## 2019-06-24 LAB — PATHOLOGY

## 2019-06-27 LAB — AEROBIC/ANAEROBIC CULTURE W GRAM STAIN (SURGICAL/DEEP WOUND)

## 2019-06-29 ENCOUNTER — Encounter: Payer: Self-pay | Admitting: Infectious Diseases

## 2019-06-29 ENCOUNTER — Ambulatory Visit: Payer: BLUE CROSS/BLUE SHIELD | Attending: Infectious Diseases | Admitting: Infectious Diseases

## 2019-06-29 ENCOUNTER — Other Ambulatory Visit: Payer: Self-pay

## 2019-06-29 VITALS — BP 117/84 | HR 90 | Temp 98.0°F | Resp 16 | Ht 65.0 in | Wt 195.0 lb

## 2019-06-29 DIAGNOSIS — B9689 Other specified bacterial agents as the cause of diseases classified elsewhere: Secondary | ICD-10-CM | POA: Diagnosis not present

## 2019-06-29 DIAGNOSIS — Z91040 Latex allergy status: Secondary | ICD-10-CM

## 2019-06-29 DIAGNOSIS — Z881 Allergy status to other antibiotic agents status: Secondary | ICD-10-CM

## 2019-06-29 DIAGNOSIS — Z888 Allergy status to other drugs, medicaments and biological substances status: Secondary | ICD-10-CM

## 2019-06-29 DIAGNOSIS — Z79899 Other long term (current) drug therapy: Secondary | ICD-10-CM

## 2019-06-29 DIAGNOSIS — Z87891 Personal history of nicotine dependence: Secondary | ICD-10-CM

## 2019-06-29 DIAGNOSIS — E785 Hyperlipidemia, unspecified: Secondary | ICD-10-CM | POA: Diagnosis not present

## 2019-06-29 DIAGNOSIS — L0291 Cutaneous abscess, unspecified: Secondary | ICD-10-CM

## 2019-06-29 DIAGNOSIS — L02511 Cutaneous abscess of right hand: Secondary | ICD-10-CM

## 2019-06-29 DIAGNOSIS — Z872 Personal history of diseases of the skin and subcutaneous tissue: Secondary | ICD-10-CM

## 2019-06-29 DIAGNOSIS — I10 Essential (primary) hypertension: Secondary | ICD-10-CM

## 2019-06-29 DIAGNOSIS — Z880 Allergy status to penicillin: Secondary | ICD-10-CM

## 2019-06-29 MED ORDER — METRONIDAZOLE 500 MG PO TABS: 500.0000 mg | ORAL_TABLET | Freq: Three times a day (TID) | ORAL | 0 refills | Status: DC

## 2019-06-29 MED ORDER — DOXYCYCLINE HYCLATE 100 MG PO TABS: 100.0000 mg | ORAL_TABLET | Freq: Two times a day (BID) | ORAL | 1 refills | Status: DC

## 2019-06-29 NOTE — Patient Instructions (Signed)
You have been referred to me by Dr.Miller for a hand infection- you had hair entering your web space and causing an abscess Late Nov and he did an I/D after christmas- he took out 2 hairs, culture had actinomyces and prevotella. You are allergic to PCN- so will give doxycycline 100mg  twice a day and flagyl 500mg  three times a day If the infection was not involving your tendon or bone you may not need more than 14 days-28 days  of antibiotic- Flagyl is for a week only. You cannot drink any alcohol with flagyl which has to be taken three times a day Will check out after you remove the sutures. Please contact me if you have any sid eeffects from these meds

## 2019-06-29 NOTE — Progress Notes (Signed)
NAME: Dawn Summers  DOB: Oct 28, 1965  MRN: [id number]  Date/Time: 06/29/2019 11:05 AM  REQUESTING PROVIDER: Dr.Miller Subjective:  REASON FOR CONSULT: abscess with actinomyces ? 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 She was doing fine until before thanks giving   2020 when she had a flare up and a new infection noted at the same site between the 3rd and the 4 th finger of rt hand, It was a small swelling initally     It started to increase in size and was painful and she took a needle and lanced it       Her PCP then gave her an antibiotic and she was referred to Dr. Maryanna Shape ortho who did an I/D of the 3rd web space and found 2 hairs which he removed- there was no tendon or joint or bone involvement. He put her on keflex- Culture came back as actinomyces and prevotella and she was referred to me. She is doing well Wound is healing well She has allergy to PCN as a child- not sure whether she has taken amoxicillin but remembered that augmentin caused her side effects   Past Medical History:  Diagnosis Date  . Anxiety   . Back pain   . Cancer (HCC)    cervical  . Carpal tunnel syndrome   . Complication of anesthesia    slow to wake up  . Family history of adverse reaction to anesthesia    slow to wake up and post op nausea  . GERD (gastroesophageal reflux disease)   . Headache   . History of kidney stones   . Hyperlipidemia   . Infected hand   . Insomnia   . Nephrolithiasis   . Sleep apnea   . Urethral diverticulum   . Vitamin D deficiency     Past Surgical History:  Procedure Laterality Date  . Bladder "Pack"  2010  . BREAST BIOPSY Left 2016   [place] bx benign  . CERVICAL SPINE SURGERY     C-3 and C-4 fusion  . I & D EXTREMITY Right 06/22/2019   Procedure: IRRIGATION AND  DEBRIDEMENT RIGHT HAND FOREIGN BODY;  Surgeon: Earnestine Leys, MD;  Location: [facility] ORS;  Service: Orthopedics;  Laterality: Right;  Marland Kitchen VAGINAL HYSTERECTOMY  1994   cervical cancer, ovaries intact    Social History   Socioeconomic History  . Marital status: Single    Spouse name: Not on file  . Number of children: 2  . Years of education: Not on file  . Highest education level: Not on file  Occupational History    Employer: [employer]  Tobacco Use  . Smoking status: Former Smoker    Packs/day: 0.25    Types: Cigarettes    Start date: 06/28/2006    Quit date: 06/22/2019    Years since quitting: 0.0  . Smokeless tobacco: Never Used  Substance and Sexual Activity  . Alcohol use: Not Currently    Alcohol/week: 0.0 standard drinks  . Drug use: No  . Sexual activity: Not Currently    Birth control/protection: Surgical  Other Topics Concern  . Not on file  Social History Narrative  . Not on file   Social Determinants of Health   Financial Resource Strain:   . Difficulty of Paying  Living Expenses: Not on file  Food Insecurity:   . Worried About Charity fundraiser in the Last Year: Not on file  . Ran Out of Food in the Last Year: Not on file  Transportation Needs:   . Lack of Transportation (Medical): Not on file  . Lack of Transportation (Non-Medical): Not on file  Physical Activity:   . Days of Exercise per Week: Not on file  . Minutes of Exercise per Session: Not on file  Stress:   . Feeling of Stress : Not on file  Social Connections:   . Frequency of Communication with Friends and Family: Not on file  . Frequency of Social Gatherings with Friends and Family: Not on file  . Attends Religious Services: Not on file  . Active Member of Clubs or Organizations: Not on file  . Attends Archivist Meetings: Not on file  . Marital Status: Not on file  Intimate Partner Violence:   . Fear of Current or Ex-Partner: Not on file  . Emotionally Abused: Not on file  .  Physically Abused: Not on file  . Sexually Abused: Not on file    Family History  Problem Relation Age of Onset  . Diabetes Mother   . Lung disease Mother        chronic  . Congestive Heart Failure Mother   . Coronary artery disease Mother   . Stroke Father   . Cancer Father        prostate cancer  . Hyperlipidemia Father   . Hypertension Father   . Colon polyps Father   . COPD Father   . Cancer Sister        lung cancer  . Breast cancer Sister   . Healthy Daughter   . Healthy Daughter    Allergies  Allergen Reactions  . Latex     Other reaction(s): Unknown  . Penicillins Hives    Did it involve swelling of the face/tongue/throat, SOB, or low BP? No Did it involve sudden or severe rash/hives, skin peeling, or any reaction on the inside of your mouth or nose? Yes Did you need to seek medical attention at a hospital or doctor's office? Yes When did it last happen?childhood  If all above answers are "NO", may proceed with cephalosporin use.   . Trazodone And Nefazodone Nausea Only and Other (See Comments)    dizziness  . Sulfa Antibiotics Nausea And Vomiting  . Trazodone Nausea Only and Rash    dizziness    ? Current Outpatient Medications  Medication Sig Dispense Refill  . cephALEXin (KEFLEX) 500 MG capsule Take 1 capsule (500 mg total) by mouth 3 (three) times daily. 30 capsule 3  . esomeprazole (NEXIUM) 40 MG capsule TAKE 1 CAPSULE(40 MG) BY MOUTH DAILY (Patient taking differently: Take 40 mg by mouth daily. ) 90 capsule 1  . gabapentin (NEURONTIN) 400 MG capsule Take 1 capsule (400 mg total) by mouth 3 (three) times daily. 60 capsule 3  . hydrochlorothiazide (HYDRODIURIL) 12.5 MG tablet Take 12.5 mg by mouth every morning.    Marland Kitchen HYDROcodone-acetaminophen (NORCO) 5-325 MG tablet Take 1-2 tablets by mouth every 6 (six) hours as needed. 50 tablet 0  . meloxicam (MOBIC) 15 MG tablet Take 1 tablet (15 mg total) by mouth daily. 30 tablet 3  . olmesartan (BENICAR) 20  MG tablet Take 30 mg by mouth every morning.    . rosuvastatin (CRESTOR) 20 MG tablet Take 20 mg by mouth at bedtime.    Marland Kitchen  erythromycin base (E-MYCIN) 500 MG tablet Take 500 mg by mouth 2 (two) times daily.    . mupirocin cream (BACTROBAN) 2 % Apply 1 application topically 2 (two) times daily. Apply to affected hand     No current facility-administered medications for this visit.     Abtx:  Anti-infectives (From admission, onward)   None      REVIEW OF SYSTEMS:  Const: negative fever, negative chills, negative weight loss Eyes: negative diplopia or visual changes, negative eye pain ENT: negative coryza, negative sore throat Resp: negative cough, hemoptysis, dyspnea Cards: negative for chest pain, palpitations, lower extremity edema GU: negative for frequency, dysuria and hematuria GI: Negative for abdominal pain, diarrhea, bleeding, constipation Skin: as above Heme: negative for easy bruising and gum/nose bleeding MS: negative for myalgias, arthralgias, back pain and muscle weakness Neurolo:negative for headaches, dizziness, vertigo, memory problems  Psych: negative for feelings of anxiety, depression  Endocrine: negative for thyroid, diabetes Allergy/Immunology-as above Objective:  VITALS:  BP 117/84   Pulse 90   Temp 98 F (36.7 C)   Resp 16   Ht 5\' 5"  (1.651 m)   Wt 195 lb (88.5 kg)   SpO2 98%   BMI 32.45 kg/m  PHYSICAL EXAM:  General: Alert, cooperative, no distress, appears stated age.  Head: Normocephalic, without obvious abnormality, atraumatic. Eyes: Conjunctivae clear, anicteric sclerae. Pupils are equal ENT did not examine Neck: Supple,  Back: No CVA tenderness. Lungs: Clear to auscultation bilaterally. No Wheezing or Rhonchi. No rales. Heart: Regular rate and rhythm, no murmur, rub or gallop. Abdomen: did not examine Extremities: rt had- sutures at the 3rd web space- no erythema- minimal swelling of the middle and ring finger        Skin: No  rashes or lesions. Or bruising Lymph: Cervical, supraclavicular normal. Neurologic: Grossly non-focal Pertinent Labs Lab Results CBC    Component Value Date/Time   WBC 7.0 06/22/2019 0613   RBC 4.77 06/22/2019 0613   HGB 14.2 06/22/2019 0613   HGB 16.1 (H) 07/31/2018 1455   HCT 43.4 06/22/2019 0613   HCT 46.7 (H) 07/31/2018 1455   PLT 288 06/22/2019 0613   PLT 299 07/31/2018 1455   MCV 91.0 06/22/2019 0613   MCV 87 07/31/2018 1455   MCH 29.8 06/22/2019 0613   MCHC 32.7 06/22/2019 0613   RDW 13.6 06/22/2019 0613   RDW 13.0 07/31/2018 1455   LYMPHSABS 2.6 07/31/2018 1455   EOSABS 0.1 07/31/2018 1455   BASOSABS 0.1 07/31/2018 1455    CMP Latest Ref Rng & Units 06/22/2019 07/31/2018 08/26/2017  Glucose 70 - 99 mg/dL 102(H) 88 99  BUN 6 - 20 mg/dL 22(H) 17 16  Creatinine 0.44 - 1.00 mg/dL 0.80 0.79 0.72  Sodium 135 - 145 mmol/L 141 142 137  Potassium 3.5 - 5.1 mmol/L 4.1 4.3 4.3  Chloride 98 - 111 mmol/L 103 100 103  CO2 22 - 32 mmol/L 28 23 24   Calcium 8.9 - 10.3 mg/dL 9.0 10.1 8.7(L)  Total Protein 6.0 - 8.5 g/dL - 7.3 7.6  Total Bilirubin 0.0 - 1.2 mg/dL - 0.3 0.8  Alkaline Phos 39 - 117 IU/L - 85 76  AST 0 - 40 IU/L - 15 25  ALT 0 - 32 IU/L - 21 24      Microbiology: Recent Results (from the past 240 hour(s))  SARS CORONAVIRUS 2 (TAT 6-24 HRS) Nasopharyngeal Nasopharyngeal Swab     Status: None   Collection Time: 06/21/19  9:31 AM   Specimen:  Nasopharyngeal Swab  Result Value Ref Range Status   SARS Coronavirus 2 NEGATIVE NEGATIVE Final    Comment: (NOTE) SARS-CoV-2 target nucleic acids are NOT DETECTED. The SARS-CoV-2 RNA is generally detectable in upper and lower respiratory specimens during the acute phase of infection. Negative results do not preclude SARS-CoV-2 infection, do not rule out co-infections with other pathogens, and should not be used as the sole basis for treatment or other patient management decisions. Negative results must be combined with  clinical observations, patient history, and epidemiological information. The expected result is Negative. Fact Sheet for Patients: SugarRoll.be Fact Sheet for Healthcare Providers: https://www.woods-mathews.com/ This test is not yet approved or cleared by the [place] FDA and  has been authorized for detection and/or diagnosis of SARS-CoV-2 by FDA under an Emergency Use Authorization (EUA). This EUA will remain  in effect (meaning this test can be used) for the duration of the COVID-19 declaration under Section 56 4(b)(1) of the Act, 21 U.S.C. section 360bbb-3(b)(1), unless the authorization is terminated or revoked sooner. Performed at [facility], [place] [address] [place], [place] [address]   Aerobic/Anaerobic Culture (surgical/deep wound)     Status: None   Collection Time: 06/22/19  8:29 AM   Specimen: PATH Other; Body Fluid  Result Value Ref Range Status   Specimen Description   Final    WOUND Performed at [facility], [address], [place], [place] [address]    Special Requests   Final    IRRIGATION RIGTH HAND FOREIGN BODY Performed at [facility], [place] [place], [place] [address]    Gram Stain   Final    RARE WBC PRESENT, PREDOMINANTLY PMN RARE GRAM POSITIVE COCCI IN CLUSTERS    Culture   Final    FEW ACTINOMYCES SPECIES FEW PREVOTELLA BIVIA BETA LACTAMASE NEGATIVE Performed at [facility], [place] [address] [place], [place] [address]    Report Status 06/27/2019 FINAL  Final    IMAGING RESULTS: I have personally reviewed the films ? 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 Actinomyces and prevotella in culture- as it is a soft tissue infection and not bone, tendon or joint antibiotic given for 7-14 days should suffice- As she is allergic to PCN would give Doxy 100g PO BID and  metronidazole . Asked her to stop keflex.  Pt drinks beer everyday and told her about metronidazole and alcohol interaction-  She will decide whether she can continue taking metronidazole or stop. The surgical site looks well . Explained the side effects of the medicines Discussed the management with Dr.Miller  HTn on olmesartan  Hyperlipidemia- on rosuvastatin  Follow up 2 weeks  ? ___________________________________________________ Discussed with patient, requesting provider

## 2019-07-13 ENCOUNTER — Ambulatory Visit: Payer: BLUE CROSS/BLUE SHIELD | Attending: Infectious Diseases | Admitting: Infectious Diseases

## 2019-07-13 ENCOUNTER — Other Ambulatory Visit: Payer: Self-pay

## 2019-07-13 DIAGNOSIS — B9689 Other specified bacterial agents as the cause of diseases classified elsewhere: Secondary | ICD-10-CM | POA: Diagnosis not present

## 2019-07-13 DIAGNOSIS — Z792 Long term (current) use of antibiotics: Secondary | ICD-10-CM

## 2019-07-13 DIAGNOSIS — L089 Local infection of the skin and subcutaneous tissue, unspecified: Secondary | ICD-10-CM | POA: Diagnosis not present

## 2019-07-13 NOTE — Progress Notes (Signed)
The purpose of this virtual visit is to provide medical care while limiting exposure to the novel coronavirus (COVID19) for both patient and office staff.   Consent was obtained for phone visit:  Yes.   Answered questions that patient had about telehealth interaction:  Yes.   I discussed the limitations, risks, security and privacy concerns of performing an evaluation and management service by Telemed.   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 She recently had surgery and 4 trapped hairs removed- culture had actinomyeces and prevotella for which she has taken 2 weeks of DOXY and 1 week of metronidazole. The infection was only in the soft tiisue. Did not affect tendoms or bone. She is doing very well On examination of her hand by televist the surgical site at 3rd web space has healed completely- Sutures have been removed- minimal pink color as part of the healing  She is taking precautions at work- covers the area with bandaid and also wears gloves She has completed antibiotics No follow up needed Discussed with patient in great detail

## 2019-08-28 ENCOUNTER — Other Ambulatory Visit: Payer: Self-pay | Admitting: Physician Assistant

## 2019-08-28 DIAGNOSIS — K219 Gastro-esophageal reflux disease without esophagitis: Secondary | ICD-10-CM

## 2019-08-28 NOTE — Telephone Encounter (Signed)
Requested Prescriptions  Pending Prescriptions Disp Refills  . esomeprazole (NEXIUM) 40 MG capsule [Pharmacy Med Name: ESOMEPRAZOLE MAGNESIUM 40MG  DR CAPS] 30 capsule 0    Sig: TAKE 1 CAPSULE(40 MG) BY MOUTH DAILY     Gastroenterology: Proton Pump Inhibitors Failed - 08/28/2019  3:25 AM      Failed - Valid encounter within last 12 months    Recent Outpatient Visits          1 year ago DDD (degenerative disc disease), lumbar   [facility] [place], [place], [place]   1 year ago Annual physical exam   [place], [place], [place]   1 year ago Acute non-recurrent pansinusitis   [place], [place]   1 year ago Acute non-recurrent pansinusitis   [facility] [place], [place], [place]   2 years ago Annual physical exam   [facility] [place], [place], [place]

## 2020-03-21 ENCOUNTER — Other Ambulatory Visit: Payer: Self-pay | Admitting: Nurse Practitioner

## 2020-03-21 DIAGNOSIS — Z1231 Encounter for screening mammogram for malignant neoplasm of breast: Secondary | ICD-10-CM

## 2020-04-14 ENCOUNTER — Other Ambulatory Visit: Payer: Self-pay

## 2020-04-14 ENCOUNTER — Ambulatory Visit
Admission: RE | Admit: 2020-04-14 | Discharge: 2020-04-14 | Disposition: A | Discharge status: Home / Self Care | Payer: BLUE CROSS/BLUE SHIELD | Source: Ambulatory Visit | Attending: Nurse Practitioner | Admitting: Nurse Practitioner

## 2020-04-14 DIAGNOSIS — Z1231 Encounter for screening mammogram for malignant neoplasm of breast: Secondary | ICD-10-CM | POA: Diagnosis not present

## 2020-12-11 ENCOUNTER — Other Ambulatory Visit: Payer: Self-pay | Admitting: Nurse Practitioner

## 2020-12-11 DIAGNOSIS — Z1231 Encounter for screening mammogram for malignant neoplasm of breast: Secondary | ICD-10-CM

## 2020-12-20 ENCOUNTER — Other Ambulatory Visit: Payer: Self-pay | Admitting: Nurse Practitioner

## 2020-12-20 DIAGNOSIS — N6459 Other signs and symptoms in breast: Secondary | ICD-10-CM

## 2021-01-04 ENCOUNTER — Ambulatory Visit
Admission: RE | Admit: 2021-01-04 | Discharge: 2021-01-04 | Disposition: A | Discharge status: Home / Self Care | Payer: BLUE CROSS/BLUE SHIELD | Source: Ambulatory Visit | Attending: Nurse Practitioner | Admitting: Nurse Practitioner

## 2021-01-04 ENCOUNTER — Other Ambulatory Visit: Payer: Self-pay

## 2021-01-04 DIAGNOSIS — N6459 Other signs and symptoms in breast: Secondary | ICD-10-CM

## 2021-05-14 ENCOUNTER — Other Ambulatory Visit: Payer: Self-pay

## 2021-05-14 ENCOUNTER — Other Ambulatory Visit: Payer: Self-pay | Admitting: Nurse Practitioner

## 2021-05-14 ENCOUNTER — Ambulatory Visit
Admission: RE | Admit: 2021-05-14 | Discharge: 2021-05-14 | Disposition: A | Discharge status: Home / Self Care | Payer: BC Managed Care – PPO | Source: Ambulatory Visit | Attending: Nurse Practitioner | Admitting: Nurse Practitioner

## 2021-05-14 ENCOUNTER — Ambulatory Visit
Admission: RE | Admit: 2021-05-14 | Discharge: 2021-05-14 | Disposition: A | Discharge status: Home / Self Care | Payer: BC Managed Care – PPO | Attending: Nurse Practitioner | Admitting: Nurse Practitioner

## 2021-05-14 DIAGNOSIS — R52 Pain, unspecified: Secondary | ICD-10-CM | POA: Insufficient documentation

## 2021-05-25 ENCOUNTER — Other Ambulatory Visit: Payer: Self-pay | Admitting: Nurse Practitioner

## 2021-05-25 DIAGNOSIS — M545 Low back pain, unspecified: Secondary | ICD-10-CM

## 2021-06-26 ENCOUNTER — Ambulatory Visit
Admission: RE | Admit: 2021-06-26 | Discharge: 2021-06-26 | Disposition: A | Discharge status: Home / Self Care | Payer: BC Managed Care – PPO | Source: Ambulatory Visit | Attending: Nurse Practitioner | Admitting: Nurse Practitioner

## 2021-06-26 ENCOUNTER — Other Ambulatory Visit: Payer: Self-pay

## 2021-06-26 DIAGNOSIS — M545 Low back pain, unspecified: Secondary | ICD-10-CM

## 2022-02-18 ENCOUNTER — Other Ambulatory Visit: Payer: Self-pay | Admitting: Nurse Practitioner

## 2022-02-18 DIAGNOSIS — Z1231 Encounter for screening mammogram for malignant neoplasm of breast: Secondary | ICD-10-CM

## 2022-08-06 ENCOUNTER — Ambulatory Visit
Payer: BC Managed Care – PPO | Attending: Student in an Organized Health Care Education/Training Program | Admitting: Student in an Organized Health Care Education/Training Program

## 2022-08-06 ENCOUNTER — Encounter: Payer: Self-pay | Admitting: Student in an Organized Health Care Education/Training Program

## 2022-08-06 VITALS — BP 133/80 | HR 63 | Temp 97.8°F | Resp 18 | Ht 65.0 in | Wt 180.0 lb

## 2022-08-06 DIAGNOSIS — M48061 Spinal stenosis, lumbar region without neurogenic claudication: Secondary | ICD-10-CM | POA: Diagnosis present

## 2022-08-06 DIAGNOSIS — G8929 Other chronic pain: Secondary | ICD-10-CM

## 2022-08-06 DIAGNOSIS — G894 Chronic pain syndrome: Secondary | ICD-10-CM | POA: Insufficient documentation

## 2022-08-06 DIAGNOSIS — M48062 Spinal stenosis, lumbar region with neurogenic claudication: Secondary | ICD-10-CM | POA: Diagnosis present

## 2022-08-06 DIAGNOSIS — M5416 Radiculopathy, lumbar region: Secondary | ICD-10-CM

## 2022-08-06 NOTE — Progress Notes (Signed)
Patient: Dawn Summers  Service Category: E/M  Provider: Gillis Santa, MD  DOB: 1965-08-23  DOS: 08/06/2022  Referring Provider: Doyle Askew, MD  MRN: [id number]  Setting: Ambulatory outpatient  PCP: Evern Bio, NP  Type: New Patient  Specialty: Interventional Pain Management    Location: Office  Delivery: Face-to-face     Primary Reason(s) for Visit: Encounter for initial evaluation of one or more chronic problems (new to examiner) potentially causing chronic pain, and posing a threat to normal musculoskeletal function. (Level of risk: High) CC: Back Pain (lower)  HPI  Ms. Dawn Summers is a 57 y.o. year old, female patient, who comes for the first time to our practice referred by Girtha Hake I, MD for our initial evaluation of her chronic pain. She has Allergic rhinitis; Back ache; Benign paroxysmal positional nystagmus; Breast lump; Carpal tunnel syndrome; History of cervical cancer in adulthood; Blood pressure elevated without history of HTN; Esophageal reflux; Cannot sleep; Calculus of kidney; Headache, migraine; Hypercholesterolemia without hypertriglyceridemia; Obstructive apnea; Snores; Radial styloid tenosynovitis; Degeneration of intervertebral disc of lumbar region; Neuritis or radiculitis due to rupture of lumbar intervertebral disc; H/O: pneumonia; Chronic radicular lumbar pain; Bladder calculus; Neuroforaminal stenosis of lumbar spine; Spinal stenosis, lumbar region, with neurogenic claudication; and Chronic pain syndrome on their problem list. Today she comes in for evaluation of her Back Pain (lower)  Pain Assessment: Location: Lower Back Radiating: to left foot which becomes numb; right side to knee Onset: More than a month ago Duration: Chronic pain Quality: Numbness, Other (Comment) ("like an aggravating toothache") Severity: 8 /10 (subjective, self-reported pain score)  Effect on ADL: limits daily activities; needs to stop and rest often; painful to sit for lengths of  time, lower back begins to feel numb Timing: Constant Modifying factors: reclining, changing positions BP: 133/80  HR: 63  Onset and Duration: Sudden and Present longer than 3 months Cause of pain: Unknown Severity: No change since onset, NAS-11 at its worse: 8/10, NAS-11 at its best: 7/10, and NAS-11 on the average: 9/10 Timing: During activity or exercise and After a period of immobility Aggravating Factors: Bending, Kneeling, Lifiting, Prolonged sitting, Prolonged standing, Squatting, Walking, Walking uphill, and Working Alleviating Factors: Lying down and Resting Associated Problems: Constipation, Night-time cramps, Fatigue, Inability to control bladder (urine), Numbness, and Tingling Quality of Pain: Constant, Feeling of constriction, Sharp, Tender, Tingling, and Uncomfortable Previous Examinations or Tests: CT scan and MRI scan Previous Treatments: Epidural steroid injections  Ms. Dawn Summers is being evaluated for possible interventional pain management therapies for the treatment of her chronic pain.   Mylinh is a pleasant 57 year old female who presents with a chief complaint of low back pain with radiation into bilateral legs in a dermatomal fashion.  She states that her radiation down her left leg is to her foot which becomes numb and that she also has radiation into her right posterior lateral knee.  She has done physical therapy in the past.  She has had transforaminal epidural steroid injections with Dr. Alba Destine as well as right L3-L4-L5 medial branch nerve block as well as RFA on 04/10/2022.  She is being referred here for discussion of potential spinal cord stimulator trial.  See procedural history below: Assessment: Chronic low back pain with bilateral lumbar radiculitis -s/p bilateral L5-S1 TFESI 08/13/2021 with 100% resolution of left leg pain  -s/p bilateral L4-5 TFESI 10/26/2021 with no significant relief -s/p bilateral L3, L4 medial branch and dorsal rami blocks 01/04/2022 with  80% relief during the anesthetic  phase -s/p bilateral L5-S1 TFESI 02/01/2022 with no significant relief -s/p right L3, L4 medial branch and dorsal rami RFA 04/10/2022 with worsening pain relief   Ms. Working has been informed that this initial visit was an evaluation only.  On the follow up appointment I will go over the results, including ordered tests and available interventional therapies. At that time she will have the opportunity to decide whether to proceed with offered therapies or not. In the event that Ms. Bertolucci prefers avoiding interventional options, this will conclude our involvement in the case.  Medication management recommendations may be provided upon request.  Meds   Current Outpatient Medications:    doxycycline (VIBRA-TABS) 100 MG tablet, Take 1 tablet (100 mg total) by mouth 2 (two) times daily., Disp: 28 tablet, Rfl: 1   esomeprazole (NEXIUM) 40 MG capsule, TAKE 1 CAPSULE(40 MG) BY MOUTH DAILY, Disp: 30 capsule, Rfl: 0   hydrochlorothiazide (HYDRODIURIL) 12.5 MG tablet, Take 12.5 mg by mouth every morning., Disp: , Rfl:    meloxicam (MOBIC) 15 MG tablet, Take 1 tablet (15 mg total) by mouth daily., Disp: 30 tablet, Rfl: 3   olmesartan (BENICAR) 20 MG tablet, Take 30 mg by mouth every morning., Disp: , Rfl:    rosuvastatin (CRESTOR) 20 MG tablet, Take 20 mg by mouth at bedtime., Disp: , Rfl:    gabapentin (NEURONTIN) 400 MG capsule, Take 1 capsule (400 mg total) by mouth 3 (three) times daily. (Patient not taking: Reported on 08/06/2022), Disp: 60 capsule, Rfl: 3  Imaging Review  DG Thoracic Spine 2 View  Narrative CLINICAL DATA:  Back pain.  EXAM: THORACIC SPINE 2 VIEWS  COMPARISON:  None.  FINDINGS: There is no acute fracture or subluxation of the thoracic spine. Lower thoracic degenerative changes with disc space narrowing and spurring. The soft tissues are unremarkable.  IMPRESSION: No acute findings.   Electronically Signed By: Anner Crete M.D. On:  05/15/2021 03:13    MR LUMBAR SPINE WO CONTRAST  Narrative CLINICAL DATA:  Low back pain, unspecified back pain laterality, unspecified chronicity, unspecified whether sciatica present. Additional history provided by scanning technologist: Patient reports chronic low back pain worse in the past few months. Patient reports pain radiating down left leg to foot with numbness and tingling in left foot. Pain exacerbated by standing for long periods.  EXAM: MRI LUMBAR SPINE WITHOUT CONTRAST  TECHNIQUE: Multiplanar, multisequence MR imaging of the lumbar spine was performed. No intravenous contrast was administered.  COMPARISON:  Lumbar spine radiographs 05/14/2021. Lumbar spine MRI 07/19/2015.  FINDINGS: Segmentation: For the purposes of this dictation, there are 5 lumbar vertebrae and the caudal most well-formed intervertebral disc space is designated L5-S1. In correlating with the prior lumbar spine radiographs of 05/14/2021, there are long transverse processes/rudimentary ribs at the T12 level.  Alignment:  Lumbar levocurvature.  No significant spondylolisthesis.  Vertebrae: Vertebral body height is maintained. No significant marrow edema or focal suspicious osseous lesion. Ventral osteophyte 6, most prominent at T11-T12, L2-L3 and L3-L4.  Conus medullaris and cauda equina: Conus extends to the L1-L2 level. No signal abnormality identified within the visualized distal spinal cord.  Paraspinal and other soft tissues: Subcentimeter T2 hyperintense focus within the right kidney, incompletely assessed on this noncontrast examination but likely reflecting a cyst. Paraspinal soft tissues unremarkable.  Disc levels:  Unless otherwise stated, the level by level findings below have not significantly changed from the prior MRI of 07/19/2015.  Mild multilevel disc degeneration, progressed and greatest at L4-L5.  T11-T12:  Imaged sagittally. Small disc bulge, progressed.  Facet arthrosis. No significant spinal canal or foraminal stenosis.  T12-L1: Minimal facet arthrosis. No significant disc herniation or stenosis.  L1-L2: Slight disc bulge. Mild facet arthrosis. No significant spinal canal or foraminal stenosis.  L2-L3: Disc bulge, progressed. New superimposed broad-based right center/subarticular disc protrusion. Facet arthrosis with mild ligamentum flavum hypertrophy, also progressed. There is now mild right subarticular narrowing (without appreciable nerve root impingement). Central canal patent. No significant foraminal stenosis.  L3-L4: Disc bulge, progressed. New superimposed small right center/subarticular disc protrusion. Minimal facet arthrosis. The disc protrusion results in mild/moderate right subarticular stenosis, contacting the descending right L4 nerve root (series 8, image 23). There is also now mild left subarticular and central canal narrowing (without appreciable nerve root impingement at these sites). Mild bilateral neural foraminal narrowing, new from the prior exam.  L4-L5: Disc bulge asymmetric to the left, progressed. Endplate spurring predominantly along the left aspect of the disc space. New broad-based shallow left subarticular/foraminal disc protrusion. Progressive facet arthrosis and ligamentum flavum hypertrophy. Progressive mild left greater than right subarticular narrowing. The disc protrusion may contact the descending left L5 nerve root (series 8, image 28). Additionally, the disc protrusion contributes to new mild left neural foraminal narrowing, likely contacting the undersurface of the exiting left L4 nerve root (series 6, image 12). Mild right neural foraminal narrowing is also new from the prior exam.  L5-S1: Disc bulge asymmetric to the left. Endplate spurring predominantly along the left aspect of the disc space. Mild facet arthrosis. No significant spinal canal stenosis. Mild left neural foraminal  narrowing is new from the prior exam. Disc osteophyte ridge may contact the undersurface of the exiting left L5 nerve root (series 6, image 12).  IMPRESSION: Lumbar spondylosis, as outlined and having progressed at several levels since the prior MRI of 07/19/2015. Findings are most notably as follows.  At L3-L4, there is a new small right center/subarticular disc protrusion which results in mild/moderate right subarticular narrowing, and contacts the descending right L4 nerve root. Multifactorial mild left subarticular, mild central canal and mild bilateral neural foraminal narrowing also now present at this level.  At L4-L5, there is progressive multifactorial mild left greater than right subarticular narrowing. A broad-based shallow disc protrusion may contact the descending left L5 nerve root. This disc protrusion also contributes to new mild left neural foraminal narrowing, and likely contacts the undersurface of the exiting left L4 nerve root. Mild right neural foraminal narrowing at this level is also new from the prior exam.  At L5-S1, there is new multifactorial mild left neural foraminal narrowing. Disc osteophyte ridge may contact the undersurface of the exiting left L5 nerve root.  Lumbar levocurvature.   Electronically Signed By: Kellie Simmering D.O. On: 06/26/2021 10:24   Narrative CLINICAL DATA:  Back pain.  EXAM: LUMBAR SPINE - COMPLETE 4+ VIEW  COMPARISON:  CT abdomen pelvis dated 09/24/2017. Lumbar spine radiograph dated 07/31/2018.  FINDINGS: Five lumbar type vertebra. There is no acute fracture or subluxation of the lumbar spine. Degenerative changes of the lower lumbar spine with facet arthropathy. There is mild levoscoliosis. The visualized posterior elements appear intact. The soft tissues are unremarkable. There is large amount of stool throughout the colon.  IMPRESSION: No acute/traumatic lumbar spine pathology.   Electronically Signed By:  Anner Crete M.D. On: 05/15/2021 03:14  DG Ankle Complete Right  Narrative CLINICAL DATA:  Injury to the right foot and ankle with pain and swelling  EXAM: RIGHT  ANKLE - COMPLETE 3+ VIEW  COMPARISON:  None.  FINDINGS: There is no evidence of fracture, dislocation, or joint effusion. There is no evidence of arthropathy or other focal bone abnormality. Soft tissues are unremarkable.  IMPRESSION: Negative.   Electronically Signed By: Donavan Foil M.D. On: 02/27/2017 14:47    Narrative CLINICAL DATA:  Injury to the right foot and ankle with pain and swelling  EXAM: RIGHT FOOT COMPLETE - 3+ VIEW  COMPARISON:  None.  FINDINGS: There is no evidence of fracture or dislocation. There is no evidence of arthropathy or other focal bone abnormality. Soft tissues are unremarkable.  IMPRESSION: Negative.   Electronically Signed By: Donavan Foil M.D. On: 02/27/2017 14:46   Complexity Note: Imaging results reviewed.                         ROS  Cardiovascular: High blood pressure Pulmonary or Respiratory: No reported pulmonary signs or symptoms such as wheezing and difficulty taking a deep full breath (Asthma), difficulty blowing air out (Emphysema), coughing up mucus (Bronchitis), persistent dry cough, or temporary stoppage of breathing during sleep Neurological: No reported neurological signs or symptoms such as seizures, abnormal skin sensations, urinary and/or fecal incontinence, being born with an abnormal open spine and/or a tethered spinal cord Psychological-Psychiatric: Anxiousness Gastrointestinal: Reflux or heatburn Genitourinary: Passing kidney stones Hematological: No reported hematological signs or symptoms such as prolonged bleeding, low or poor functioning platelets, bruising or bleeding easily, hereditary bleeding problems, low energy levels due to low hemoglobin or being anemic Endocrine: No reported endocrine signs or symptoms such as high or low  blood sugar, rapid heart rate due to high thyroid levels, obesity or weight gain due to slow thyroid or thyroid disease Rheumatologic: No reported rheumatological signs and symptoms such as fatigue, joint pain, tenderness, swelling, redness, heat, stiffness, decreased range of motion, with or without associated rash Musculoskeletal: Negative for myasthenia gravis, muscular dystrophy, multiple sclerosis or malignant hyperthermia Work History: Working full time  Allergies  Ms. Dawn Summers is allergic to latex, penicillins, trazodone and nefazodone, sulfa antibiotics, and trazodone.  Laboratory Chemistry Profile   Renal Lab Results  Component Value Date   BUN 22 (H) 06/22/2019   CREATININE 0.80 06/22/2019   BCR 22 07/31/2018   GFRAA >60 06/22/2019   GFRNONAA >60 06/22/2019   SPECGRAV 1.010 09/15/2017   PHUR 6.0 09/15/2017   PROTEINUR Negative 09/15/2017     Electrolytes Lab Results  Component Value Date   NA 141 06/22/2019   K 4.1 06/22/2019   CL 103 06/22/2019   CALCIUM 9.0 06/22/2019     Hepatic Lab Results  Component Value Date   AST 15 07/31/2018   ALT 21 07/31/2018   ALBUMIN 4.8 07/31/2018   ALKPHOS 85 07/31/2018   LIPASE 30 08/26/2017     ID Lab Results  Component Value Date   HIV Non Reactive 07/29/2017   [place] NEGATIVE 06/21/2019     Bone No results found for: "VD25OH", "VD125OH2TOT", "[id number]", "UK:[id number]", "25OHVITD1", "25OHVITD2", "[id number]", "TESTOFREE", "TESTOSTERONE"   Endocrine Lab Results  Component Value Date   GLUCOSE 102 (H) 06/22/2019   GLUCOSEU Negative 09/15/2017   HGBA1C 5.7 (H) 07/31/2018   TSH 1.110 07/31/2018     Neuropathy Lab Results  Component Value Date   HGBA1C 5.7 (H) 07/31/2018   HIV Non Reactive 07/29/2017     CNS No results found for: "COLORCSF", "APPEARCSF", "RBCCOUNTCSF", "WBCCSF", "POLYSCSF", "LYMPHSCSF", "EOSCSF", "PROTEINCSF", "GLUCCSF", "JCVIRUS", "CSFOLI", "IGGCSF", "LABACHR", "  ACETBL"   Inflammation (CRP:  Acute  ESR: Chronic) No results found for: "CRP", "ESRSEDRATE", "LATICACIDVEN"   Rheumatology No results found for: "RF", "ANA", "LABURIC", "URICUR", "LYMEIGGIGMAB", "LYMEABIGMQN", "HLAB27"   Coagulation Lab Results  Component Value Date   PLT 288 06/22/2019     Cardiovascular Lab Results  Component Value Date   HGB 14.2 06/22/2019   HCT 43.4 06/22/2019     Screening Lab Results  Component Value Date   SARSCOV2NAA NEGATIVE 06/21/2019   HIV Non Reactive 07/29/2017     Cancer No results found for: "CEA", "CA125", "LABCA2"   Allergens No results found for: "ALMOND", "APPLE", "ASPARAGUS", "AVOCADO", "BANANA", "BARLEY", "BASIL", "BAYLEAF", "GREENBEAN", "LIMABEAN", "WHITEBEAN", "BEEFIGE", "REDBEET", "BLUEBERRY", "BROCCOLI", "CABBAGE", "MELON", "CARROT", "CASEIN", "CASHEWNUT", "CAULIFLOWER", "CELERY"     Note: Lab results reviewed.  [place]  Drug: Ms. Dawn Summers  reports no history of drug use. Alcohol:  reports that she does not currently use alcohol. Tobacco:  reports that she quit smoking about 3 years ago. Her smoking use included cigarettes. She started smoking about 16 years ago. She smoked an average of .25 packs per day. She has never used smokeless tobacco. Medical:  has a past medical history of Anxiety, Back pain, Cancer ([place]), Carpal tunnel syndrome, Complication of anesthesia, Family history of adverse reaction to anesthesia, GERD (gastroesophageal reflux disease), Headache, History of kidney stones, Hyperlipidemia, Infected hand, Insomnia, Nephrolithiasis, Sleep apnea, Urethral diverticulum, and Vitamin D deficiency. Family: family history includes Breast cancer in her sister; COPD in her father; Cancer in her father and sister; Colon polyps in her father; Congestive Heart Failure in her mother; Coronary artery disease in her mother; Diabetes in her mother; Healthy in her daughter and daughter; Hyperlipidemia in her father; Hypertension in her father; Lung disease in her mother;  Stroke in her father.  Past Surgical History:  Procedure Laterality Date   Bladder "Pack"  2010   BREAST BIOPSY Left 2016   [place] bx benign   CERVICAL SPINE SURGERY     C-3 and C-4 fusion   I & D EXTREMITY Right 06/22/2019   Procedure: IRRIGATION AND DEBRIDEMENT RIGHT HAND FOREIGN BODY;  Surgeon: Earnestine Leys, MD;  Location: [facility] ORS;  Service: Orthopedics;  Laterality: Right;   VAGINAL HYSTERECTOMY  1994   cervical cancer, ovaries intact   Active Ambulatory Problems    Diagnosis Date Noted   Allergic rhinitis 09/18/2007   Back ache 12/30/2014   Benign paroxysmal positional nystagmus 12/30/2014   Breast lump 12/30/2014   Carpal tunnel syndrome 09/18/2007   History of cervical cancer in adulthood 12/30/2014   Blood pressure elevated without history of HTN 08/18/2009   Esophageal reflux 12/30/2014   Cannot sleep 09/18/2007   Calculus of kidney 12/30/2014   Headache, migraine 09/18/2007   Hypercholesterolemia without hypertriglyceridemia 09/18/2007   Obstructive apnea 12/30/2014   Snores 12/30/2014   Radial styloid tenosynovitis 08/15/2009   Degeneration of intervertebral disc of lumbar region 08/07/2015   Neuritis or radiculitis due to rupture of lumbar intervertebral disc 08/07/2015   H/O: pneumonia 04/17/2016   Chronic radicular lumbar pain 08/07/2015   Bladder calculus 08/28/2017   Neuroforaminal stenosis of lumbar spine 08/06/2022   Spinal stenosis, lumbar region, with neurogenic claudication 08/06/2022   Chronic pain syndrome 08/06/2022   Resolved Ambulatory Problems    Diagnosis Date Noted   Screening for gout 12/30/2014   Closed fracture of one rib 07/03/2009   Extremity pain 07/12/2009   Bacterial pneumonia 12/30/2014   Right flank pain 12/30/2014  Tobacco use 09/18/2007   Avitaminosis D 12/30/2014   Past Medical History:  Diagnosis Date   Anxiety    Back pain    Cancer ([place])    Complication of anesthesia    Family history of adverse reaction to  anesthesia    GERD (gastroesophageal reflux disease)    Headache    History of kidney stones    Hyperlipidemia    Infected hand    Insomnia    Nephrolithiasis    Sleep apnea    Urethral diverticulum    Vitamin D deficiency    Constitutional Exam  General appearance: Well nourished, well developed, and well hydrated. In no apparent acute distress Vitals:   08/06/22 0839  BP: 133/80  Pulse: 63  Resp: 18  Temp: 97.8 F (36.6 C)  TempSrc: Temporal  SpO2: 97%  Weight: 180 lb (81.6 kg)  Height: 5' 5"$  (1.651 m)   BMI Assessment: Estimated body mass index is 29.95 kg/m as calculated from the following:   Height as of this encounter: 5' 5"$  (1.651 m).   Weight as of this encounter: 180 lb (81.6 kg).  BMI interpretation table: BMI level Category Range association with higher incidence of chronic pain  <18 kg/m2 Underweight   18.5-24.9 kg/m2 Ideal body weight   25-29.9 kg/m2 Overweight Increased incidence by 20%  30-34.9 kg/m2 Obese (Class I) Increased incidence by 68%  35-39.9 kg/m2 Severe obesity (Class II) Increased incidence by 136%  >40 kg/m2 Extreme obesity (Class III) Increased incidence by 254%   Patient's current BMI Ideal Body weight  Body mass index is 29.95 kg/m. Ideal body weight: 57 kg (125 lb 10.6 oz) Adjusted ideal body weight: 66.9 kg (147 lb 6.4 oz)   BMI Readings from Last 4 Encounters:  08/06/22 29.95 kg/m  06/29/19 32.45 kg/m  06/21/19 30.79 kg/m  08/28/18 32.78 kg/m   Wt Readings from Last 4 Encounters:  08/06/22 180 lb (81.6 kg)  06/29/19 195 lb (88.5 kg)  06/21/19 185 lb (83.9 kg)  08/28/18 197 lb (89.4 kg)    Psych/Mental status: Alert, oriented x 3 (person, place, & time)       Eyes: PERLA Respiratory: No evidence of acute respiratory distress  Thoracic Spine Area Exam  Skin & Axial Inspection: No masses, redness, or swelling Alignment: Symmetrical Functional ROM: Pain restricted ROM Stability: No instability detected Muscle  Tone/Strength: Functionally intact. No obvious neuro-muscular anomalies detected. Sensory (Neurological): Dermatomal pain pattern Muscle strength & Tone: No palpable anomalies Lumbar Spine Area Exam  Skin & Axial Inspection: No masses, redness, or swelling Alignment: Symmetrical Functional ROM: Pain restricted ROM       Stability: No instability detected Muscle Tone/Strength: Functionally intact. No obvious neuro-muscular anomalies detected. Sensory (Neurological): Dermatomal pain pattern  Gait & Posture Assessment  Ambulation: Unassisted Gait: Relatively normal for age and body habitus Posture: WNL  Lower Extremity Exam    Side: Right lower extremity  Side: Left lower extremity  Stability: No instability observed          Stability: No instability observed          Skin & Extremity Inspection: Skin color, temperature, and hair growth are WNL. No peripheral edema or cyanosis. No masses, redness, swelling, asymmetry, or associated skin lesions. No contractures.  Skin & Extremity Inspection: Skin color, temperature, and hair growth are WNL. No peripheral edema or cyanosis. No masses, redness, swelling, asymmetry, or associated skin lesions. No contractures.  Functional ROM: Unrestricted ROM  Functional ROM: Unrestricted ROM                  Muscle Tone/Strength: Functionally intact. No obvious neuro-muscular anomalies detected.  Muscle Tone/Strength: Functionally intact. No obvious neuro-muscular anomalies detected.  Sensory (Neurological): Dermatomal pain pattern        Sensory (Neurological): Dermatomal pain pattern        DTR: Patellar: deferred today Achilles: deferred today Plantar: deferred today  DTR: Patellar: deferred today Achilles: deferred today Plantar: deferred today  Palpation: No palpable anomalies  Palpation: No palpable anomalies    Assessment  Primary Diagnosis & Pertinent Problem List: The primary encounter diagnosis was Chronic radicular lumbar  pain. Diagnoses of Neuroforaminal stenosis of lumbar spine, Spinal stenosis, lumbar region, with neurogenic claudication, and Chronic pain syndrome were also pertinent to this visit.  Visit Diagnosis (New problems to examiner): 1. Chronic radicular lumbar pain   2. Neuroforaminal stenosis of lumbar spine   3. Spinal stenosis, lumbar region, with neurogenic claudication   4. Chronic pain syndrome    Plan of Care (Initial workup plan)  Discussed spinal cord stimulation with patient in detail.  I reviewed her lumbar MRI with her.  I reviewed what a spinal cord stimulator trial would entail using a spine model.  She states that she is not ready to move forward with this yet but wanted to get more information.  I informed her that we would need to update imaging studies and obtain a thoracic, lumbar MRI and also complete a psych evaluation if she would like to move forward with a spinal cord stimulator trial.  Patient states that she will contact us if she would like to move forward with it.  Provider-requested follow-up: No follow-ups on file.  Future Appointments  Date Time Provider [place]  08/09/2022 10:00 AM Evern Bio, NP AMA-AMA None    Duration of encounter: 63mnutes.  Total time on encounter, as per AMA guidelines included both the face-to-face and non-face-to-face time personally spent by the physician and/or other qualified health care professional(s) on the day of the encounter (includes time in activities that require the physician or other qualified health care professional and does not include time in activities normally performed by clinical staff). Physician's time may include the following activities when performed: Preparing to see the patient (e.g., pre-charting review of records, searching for previously ordered imaging, lab work, and nerve conduction tests) Review of prior analgesic pharmacotherapies. Reviewing PMP Interpreting ordered tests (e.g., lab work,  imaging, nerve conduction tests) Performing post-procedure evaluations, including interpretation of diagnostic procedures Obtaining and/or reviewing separately obtained history Performing a medically appropriate examination and/or evaluation Counseling and educating the patient/family/caregiver Ordering medications, tests, or procedures Referring and communicating with other health care professionals (when not separately reported) Documenting clinical information in the electronic or other health record Independently interpreting results (not separately reported) and communicating results to the patient/ family/caregiver Care coordination (not separately reported)  Note by: BGillis Santa MD Date: 08/06/2022; Time: 9:26 AM

## 2022-08-06 NOTE — Progress Notes (Signed)
Safety precautions to be maintained throughout the outpatient stay will include: orient to surroundings, keep bed in low position, maintain call bell within reach at all times, provide assistance with transfer out of bed and ambulation.  

## 2022-08-07 ENCOUNTER — Other Ambulatory Visit: Payer: BC Managed Care – PPO

## 2022-08-07 ENCOUNTER — Other Ambulatory Visit: Payer: Self-pay | Admitting: Nurse Practitioner

## 2022-08-08 LAB — LIPID PANEL W/O CHOL/HDL RATIO
Cholesterol, Total: 137 mg/dL (ref 100–199)
HDL: 72 mg/dL (ref >39)
LDL Chol Calc (NIH): 56 mg/dL (ref 0–99)
Triglycerides: 37 mg/dL (ref 0–149)
VLDL Cholesterol Cal: 9 mg/dL (ref 5–40)

## 2022-08-08 LAB — COMPREHENSIVE METABOLIC PANEL
ALT: 21 IU/L (ref 0–32)
AST: 18 IU/L (ref 0–40)
Albumin/Globulin Ratio: 2 (ref 1.2–2.2)
Albumin: 4.2 g/dL (ref 3.8–4.9)
Alkaline Phosphatase: 75 IU/L (ref 44–121)
BUN/Creatinine Ratio: 23 (ref 9–23)
BUN: 22 mg/dL (ref 6–24)
Bilirubin Total: 0.4 mg/dL (ref 0.0–1.2)
CO2: 24 mmol/L (ref 20–29)
Calcium: 9.4 mg/dL (ref 8.7–10.2)
Chloride: 103 mmol/L (ref 96–106)
Creatinine, Ser: 0.94 mg/dL (ref 0.57–1.00)
Globulin, Total: 2.1 g/dL (ref 1.5–4.5)
Glucose: 101 mg/dL — ABNORMAL HIGH (ref 70–99)
Potassium: 4.9 mmol/L (ref 3.5–5.2)
Sodium: 143 mmol/L (ref 134–144)
Total Protein: 6.3 g/dL (ref 6.0–8.5)
eGFR: 70 mL/min/{1.73_m2} (ref >59)

## 2022-08-08 LAB — TSH: TSH: 1.34 u[IU]/mL (ref 0.450–4.500)

## 2022-08-08 LAB — HGB A1C W/O EAG: Hgb A1c MFr Bld: 6 % — ABNORMAL HIGH (ref 4.8–5.6)

## 2022-08-09 ENCOUNTER — Encounter: Payer: Self-pay | Admitting: Nurse Practitioner

## 2022-08-09 ENCOUNTER — Ambulatory Visit: Payer: BC Managed Care – PPO | Admitting: Nurse Practitioner

## 2022-08-09 VITALS — BP 128/74 | HR 73 | Ht 65.0 in | Wt 189.0 lb

## 2022-08-09 DIAGNOSIS — J301 Allergic rhinitis due to pollen: Secondary | ICD-10-CM

## 2022-08-09 DIAGNOSIS — G8929 Other chronic pain: Secondary | ICD-10-CM

## 2022-08-09 DIAGNOSIS — M545 Low back pain, unspecified: Secondary | ICD-10-CM | POA: Diagnosis not present

## 2022-08-09 DIAGNOSIS — G894 Chronic pain syndrome: Secondary | ICD-10-CM

## 2022-08-09 DIAGNOSIS — G43909 Migraine, unspecified, not intractable, without status migrainosus: Secondary | ICD-10-CM | POA: Diagnosis not present

## 2022-08-09 NOTE — Progress Notes (Signed)
Established Patient Office Visit  Subjective:  Patient ID: Dawn Summers, female    DOB: 1966/01/08  Age: 57 y.o. MRN: [id number]  Chief Complaint  Patient presents with   Follow-up    Lab result    4 month follow up and discussed lab results, BP at goal.       Past Medical History:  Diagnosis Date   Anxiety    Back pain    Cancer ([place])    cervical   Carpal tunnel syndrome    Complication of anesthesia    slow to wake up   Family history of adverse reaction to anesthesia    slow to wake up and post op nausea   GERD (gastroesophageal reflux disease)    Headache    History of kidney stones    Hyperlipidemia    Infected hand    Insomnia    Nephrolithiasis    Sleep apnea    Urethral diverticulum    Vitamin D deficiency     Social History   Socioeconomic History   Marital status: Single    Spouse name: Not on file   Number of children: 2   Years of education: Not on file   Highest education level: Not on file  Occupational History    Employer: [employer]  Tobacco Use   Smoking status: Former    Packs/day: 0.25    Types: Cigarettes    Start date: 06/28/2006    Quit date: 06/22/2019    Years since quitting: 3.1   Smokeless tobacco: Never  Vaping Use   Vaping Use: Never used  Substance and Sexual Activity   Alcohol use: Not Currently    Alcohol/week: 0.0 standard drinks of alcohol   Drug use: No   Sexual activity: Not Currently    Birth control/protection: Surgical  Other Topics Concern   Not on file  Social History Narrative   Not on file   Social Determinants of Health   Financial Resource Strain: Not on file  Food Insecurity: Not on file  Transportation Needs: Not on file  Physical Activity: Not on file  Stress: Not on file  Social Connections: Not on file  Intimate Partner Violence: Not on file    Family History  Problem Relation Age of Onset   Diabetes Mother    Lung disease Mother        chronic   Congestive Heart Failure Mother     Coronary artery disease Mother    Stroke Father    Cancer Father        prostate cancer   Hyperlipidemia Father    Hypertension Father    Colon polyps Father    COPD Father    Cancer Sister        lung cancer   Breast cancer Sister    Healthy Daughter    Healthy Daughter     Allergies  Allergen Reactions   Latex     Other reaction(s): Unknown   Penicillins Hives    Did it involve swelling of the face/tongue/throat, SOB, or low BP? No Did it involve sudden or severe rash/hives, skin peeling, or any reaction on the inside of your mouth or nose? Yes Did you need to seek medical attention at a hospital or doctor's office? Yes When did it last happen?     childhood   If all above answers are "NO", may proceed with cephalosporin use.    Trazodone And Nefazodone Nausea Only and Other (See Comments)  dizziness   Sulfa Antibiotics Nausea And Vomiting   Trazodone Nausea Only and Rash    dizziness    Review of Systems  Constitutional: Negative.   HENT: Negative.    Eyes: Negative.   Respiratory: Negative.    Cardiovascular: Negative.   Gastrointestinal: Negative.   Genitourinary: Negative.   Musculoskeletal:  Positive for back pain.  Skin: Negative.   Neurological: Negative.   Endo/Heme/Allergies: Negative.   Psychiatric/Behavioral: Negative.         Objective:   BP 128/74   Pulse 73   Ht 5' 5"$  (1.651 m)   Wt 189 lb (85.7 kg)   SpO2 97%   BMI 31.45 kg/m   Vitals:   08/09/22 0938  BP: 128/74  Pulse: 73  Height: 5' 5"$  (1.651 m)  Weight: 189 lb (85.7 kg)  SpO2: 97%  BMI (Calculated): 31.45    Physical Exam Vitals reviewed.  Constitutional:      Appearance: Normal appearance.  HENT:     Head: Normocephalic.     Nose: Nose normal.  Eyes:     Pupils: Pupils are equal, round, and reactive to light.  Cardiovascular:     Rate and Rhythm: Normal rate and regular rhythm.  Pulmonary:     Effort: Pulmonary effort is normal.     Breath sounds: Normal breath  sounds.  Abdominal:     General: Bowel sounds are normal.     Palpations: Abdomen is soft.  Musculoskeletal:        General: Tenderness present.     Cervical back: Neck supple.  Skin:    General: Skin is warm and dry.  Neurological:     Mental Status: She is alert and oriented to person, place, and time.  Psychiatric:        Mood and Affect: Mood normal.        Behavior: Behavior normal.      No results found for any visits on 08/09/22.  Recent Results (from the past 2160 hour(s))  Comprehensive metabolic panel     Status: Abnormal   Collection Time: 08/07/22  8:41 AM  Result Value Ref Range   Glucose 101 (H) 70 - 99 mg/dL   BUN 22 6 - 24 mg/dL   Creatinine, Ser 0.94 0.57 - 1.00 mg/dL   eGFR 70 >59 mL/min/1.73   BUN/Creatinine Ratio 23 9 - 23   Sodium 143 134 - 144 mmol/L   Potassium 4.9 3.5 - 5.2 mmol/L   Chloride 103 96 - 106 mmol/L   CO2 24 20 - 29 mmol/L   Calcium 9.4 8.7 - 10.2 mg/dL   Total Protein 6.3 6.0 - 8.5 g/dL   Albumin 4.2 3.8 - 4.9 g/dL   Globulin, Total 2.1 1.5 - 4.5 g/dL   Albumin/Globulin Ratio 2.0 1.2 - 2.2   Bilirubin Total 0.4 0.0 - 1.2 mg/dL   Alkaline Phosphatase 75 44 - 121 IU/L   AST 18 0 - 40 IU/L   ALT 21 0 - 32 IU/L  Lipid Panel w/o Chol/HDL Ratio     Status: None   Collection Time: 08/07/22  8:41 AM  Result Value Ref Range   Cholesterol, Total 137 100 - 199 mg/dL   Triglycerides 37 0 - 149 mg/dL   HDL 72 >39 mg/dL   VLDL Cholesterol Cal 9 5 - 40 mg/dL   LDL Chol Calc (NIH) 56 0 - 99 mg/dL  Hgb A1c w/o eAG     Status: Abnormal   Collection Time: 08/07/22  8:41 AM  Result Value Ref Range   Hgb A1c MFr Bld 6.0 (H) 4.8 - 5.6 %    Comment:          Prediabetes: 5.7 - 6.4          Diabetes: >6.4          Glycemic control for adults with diabetes: <7.0   TSH     Status: None   Collection Time: 08/07/22  8:41 AM  Result Value Ref Range   TSH 1.340 0.450 - 4.500 uIU/mL      Assessment & Plan:   Problem List Items Addressed This  Visit       Cardiovascular and Mediastinum   Headache, migraine - Primary     Respiratory   Allergic rhinitis     Other   Back ache   Chronic pain syndrome    Return in about 4 months (around 12/08/2022) for Followup appt in 4 months, fasting labs prior.   Total time spent: 25 minutes  Evern Bio, NP  08/09/2022

## 2022-08-09 NOTE — Patient Instructions (Signed)
1) Follow up appt in 4 months, fasting labs prior 2) Great job on blood work, tighten up with more protein and veggies

## 2022-08-24 ENCOUNTER — Other Ambulatory Visit: Payer: Self-pay | Admitting: Nurse Practitioner

## 2022-08-25 ENCOUNTER — Other Ambulatory Visit: Payer: Self-pay | Admitting: Nurse Practitioner

## 2022-08-26 ENCOUNTER — Other Ambulatory Visit: Payer: Self-pay | Admitting: Nurse Practitioner

## 2022-10-03 ENCOUNTER — Ambulatory Visit
Admission: RE | Admit: 2022-10-03 | Discharge: 2022-10-03 | Disposition: A | Discharge status: Home / Self Care | Payer: BC Managed Care – PPO | Source: Ambulatory Visit | Attending: Nurse Practitioner | Admitting: Nurse Practitioner

## 2022-10-03 DIAGNOSIS — Z1231 Encounter for screening mammogram for malignant neoplasm of breast: Secondary | ICD-10-CM | POA: Diagnosis present

## 2022-11-03 ENCOUNTER — Other Ambulatory Visit: Payer: Self-pay | Admitting: Nurse Practitioner

## 2022-11-04 ENCOUNTER — Other Ambulatory Visit: Payer: Self-pay | Admitting: Nurse Practitioner

## 2022-12-09 ENCOUNTER — Other Ambulatory Visit: Payer: Self-pay | Admitting: Physician Assistant

## 2022-12-09 DIAGNOSIS — M5416 Radiculopathy, lumbar region: Secondary | ICD-10-CM

## 2022-12-12 ENCOUNTER — Ambulatory Visit: Payer: BC Managed Care – PPO | Admitting: Nurse Practitioner

## 2022-12-17 ENCOUNTER — Other Ambulatory Visit: Payer: BC Managed Care – PPO

## 2022-12-17 ENCOUNTER — Ambulatory Visit
Admission: RE | Admit: 2022-12-17 | Discharge: 2022-12-17 | Disposition: A | Discharge status: Home / Self Care | Payer: BC Managed Care – PPO | Source: Ambulatory Visit | Attending: Physician Assistant | Admitting: Physician Assistant

## 2022-12-17 DIAGNOSIS — R7301 Impaired fasting glucose: Secondary | ICD-10-CM

## 2022-12-17 DIAGNOSIS — M5416 Radiculopathy, lumbar region: Secondary | ICD-10-CM | POA: Diagnosis present

## 2022-12-17 DIAGNOSIS — E7800 Pure hypercholesterolemia, unspecified: Secondary | ICD-10-CM

## 2022-12-17 DIAGNOSIS — R030 Elevated blood-pressure reading, without diagnosis of hypertension: Secondary | ICD-10-CM

## 2022-12-18 LAB — CBC WITH DIFFERENTIAL
Basophils Absolute: 0.1 10*3/uL (ref 0.0–0.2)
Basos: 1 %
EOS (ABSOLUTE): 0.2 10*3/uL (ref 0.0–0.4)
Eos: 2 %
Hematocrit: 41.9 % (ref 34.0–46.6)
Hemoglobin: 14.4 g/dL (ref 11.1–15.9)
Immature Grans (Abs): 0 10*3/uL (ref 0.0–0.1)
Immature Granulocytes: 0 %
Lymphocytes Absolute: 1.8 10*3/uL (ref 0.7–3.1)
Lymphs: 26 %
MCH: 29.6 pg (ref 26.6–33.0)
MCHC: 34.4 g/dL (ref 31.5–35.7)
MCV: 86 fL (ref 79–97)
Monocytes Absolute: 0.5 10*3/uL (ref 0.1–0.9)
Monocytes: 7 %
Neutrophils Absolute: 4.5 10*3/uL (ref 1.4–7.0)
Neutrophils: 64 %
RBC: 4.87 x10E6/uL (ref 3.77–5.28)
RDW: 12.9 % (ref 11.7–15.4)
WBC: 7 10*3/uL (ref 3.4–10.8)

## 2022-12-18 LAB — HEMOGLOBIN A1C
Est. average glucose Bld gHb Est-mCnc: 131 mg/dL
Hgb A1c MFr Bld: 6.2 % — ABNORMAL HIGH (ref 4.8–5.6)

## 2022-12-18 LAB — CMP14+EGFR
ALT: 23 IU/L (ref 0–32)
AST: 18 IU/L (ref 0–40)
Albumin: 4.4 g/dL (ref 3.8–4.9)
Alkaline Phosphatase: 86 IU/L (ref 44–121)
BUN/Creatinine Ratio: 31 — ABNORMAL HIGH (ref 9–23)
BUN: 30 mg/dL — ABNORMAL HIGH (ref 6–24)
Bilirubin Total: 0.5 mg/dL (ref 0.0–1.2)
CO2: 22 mmol/L (ref 20–29)
Calcium: 9.6 mg/dL (ref 8.7–10.2)
Chloride: 102 mmol/L (ref 96–106)
Creatinine, Ser: 0.98 mg/dL (ref 0.57–1.00)
Globulin, Total: 2.2 g/dL (ref 1.5–4.5)
Glucose: 105 mg/dL — ABNORMAL HIGH (ref 70–99)
Potassium: 4.6 mmol/L (ref 3.5–5.2)
Sodium: 140 mmol/L (ref 134–144)
Total Protein: 6.6 g/dL (ref 6.0–8.5)
eGFR: 67 mL/min/{1.73_m2} (ref >59)

## 2022-12-18 LAB — LIPID PANEL
Chol/HDL Ratio: 2.1 ratio (ref 0.0–4.4)
Cholesterol, Total: 152 mg/dL (ref 100–199)
HDL: 73 mg/dL (ref >39)
LDL Chol Calc (NIH): 68 mg/dL (ref 0–99)
Triglycerides: 49 mg/dL (ref 0–149)
VLDL Cholesterol Cal: 11 mg/dL (ref 5–40)

## 2022-12-18 LAB — TSH: TSH: 1.21 u[IU]/mL (ref 0.450–4.500)

## 2022-12-20 ENCOUNTER — Other Ambulatory Visit: Payer: Self-pay | Admitting: Nurse Practitioner

## 2022-12-23 ENCOUNTER — Ambulatory Visit: Payer: BC Managed Care – PPO | Admitting: Nurse Practitioner

## 2023-01-10 ENCOUNTER — Ambulatory Visit: Payer: BC Managed Care – PPO | Admitting: Cardiology

## 2023-01-10 ENCOUNTER — Encounter: Payer: Self-pay | Admitting: Cardiology

## 2023-01-10 VITALS — BP 122/70 | HR 78 | Ht 65.0 in | Wt 200.2 lb

## 2023-01-10 DIAGNOSIS — F419 Anxiety disorder, unspecified: Secondary | ICD-10-CM

## 2023-01-10 DIAGNOSIS — R7303 Prediabetes: Secondary | ICD-10-CM

## 2023-01-10 DIAGNOSIS — E782 Mixed hyperlipidemia: Secondary | ICD-10-CM | POA: Diagnosis not present

## 2023-01-10 DIAGNOSIS — I10 Essential (primary) hypertension: Secondary | ICD-10-CM

## 2023-01-10 DIAGNOSIS — E669 Obesity, unspecified: Secondary | ICD-10-CM

## 2023-01-10 MED ORDER — SEMAGLUTIDE(0.25 OR 0.5MG/DOS) 2 MG/3ML ~~LOC~~ SOPN: 0.2500 mg | PEN_INJECTOR | SUBCUTANEOUS | 3 refills | Status: DC

## 2023-01-10 MED ORDER — MELOXICAM 15 MG PO TABS: 15.0000 mg | ORAL_TABLET | Freq: Every day | ORAL | 3 refills | Status: DC

## 2023-01-10 NOTE — Progress Notes (Signed)
Established Patient Office Visit  Subjective:  Patient ID: Dawn Summers, female    DOB: October 20, 1965  Age: 57 y.o. MRN: [id number]  Chief Complaint  Patient presents with   Follow-up    4 month follow up    Patient in office for 4 month follow up, discuss recent lab results.Patient complaining of ongoing hip pain, seeing orthopaedics.  Has been taking hydroxyzine 5 mg daily every morning.  Recent lab work. Hgb A1c stable. Lipid panel well controlled. Patient requesting weight loss medication. States she has taken phentermine in the past with no success. Unsure if her insurance will pay for an injectable medication despite being pre diabetic with co morbidities.  Up to date on colonoscopy, mammogram, and pap smear.     No other concerns at this time.   Past Medical History:  Diagnosis Date   Anxiety    Back pain    Cancer ([facility])    cervical   Carpal tunnel syndrome    Complication of anesthesia    slow to wake up   Family history of adverse reaction to anesthesia    slow to wake up and post op nausea   GERD (gastroesophageal reflux disease)    Headache    History of kidney stones    Hyperlipidemia    Infected hand    Insomnia    Nephrolithiasis    Sleep apnea    Urethral diverticulum    Vitamin D deficiency     Past Surgical History:  Procedure Laterality Date   Bladder "Pack"  2010   BREAST BIOPSY Left 2016   [place] bx benign   CERVICAL SPINE SURGERY     C-3 and C-4 fusion   I & D EXTREMITY Right 06/22/2019   Procedure: IRRIGATION AND DEBRIDEMENT RIGHT HAND FOREIGN BODY;  Surgeon: Deeann Saint, MD;  Location: [facility] ORS;  Service: Orthopedics;  Laterality: Right;   VAGINAL HYSTERECTOMY  1994   cervical cancer, ovaries intact    Social History   Socioeconomic History   Marital status: Single    Spouse name: Not on file   Number of children: 2   Years of education: Not on file   Highest education level: Not on file  Occupational History    Employer: [employer]  Tobacco Use   Smoking status: Former    Current packs/day: 0.00    Average packs/day: 0.3 packs/day for 13.0 years (3.2 ttl pk-yrs)    Types: Cigarettes    Start date: 06/28/2006    Quit date: 06/22/2019    Years since quitting: 3.5   Smokeless tobacco: Never  Vaping Use   Vaping status: Never Used  Substance and Sexual Activity   Alcohol use: Not Currently    Alcohol/week: 0.0 standard drinks of alcohol   Drug use: No   Sexual activity: Not Currently    Birth control/protection: Surgical  Other Topics Concern   Not on file  Social History Narrative   Not on file   Social Determinants of Health   Financial Resource Strain: Not on file  Food Insecurity: Not on file  Transportation Needs: Not on file  Physical Activity: Not on file  Stress: Not on file  Social Connections: Not on file  Intimate Partner Violence: Not on file    Family History  Problem Relation Age of Onset   Diabetes Mother    Lung disease Mother        chronic   Congestive Heart Failure Mother    Coronary  artery disease Mother    Stroke Father    Cancer Father        prostate cancer   Hyperlipidemia Father    Hypertension Father    Colon polyps Father    COPD Father    Cancer Sister        lung cancer   Breast cancer Sister    Healthy Daughter    Healthy Daughter     Allergies  Allergen Reactions   Latex     Other reaction(s): Unknown   Penicillins Hives    Did it involve swelling of the face/tongue/throat, SOB, or low BP? No Did it involve sudden or severe rash/hives, skin peeling, or any reaction on the inside of your mouth or nose? Yes Did you need to seek medical attention at a hospital or doctor's office? Yes When did it last happen?     childhood   If all above answers are "NO", may proceed with cephalosporin use.    Trazodone And Nefazodone Nausea Only and Other (See Comments)    dizziness   Sulfa Antibiotics Nausea And Vomiting   Trazodone Nausea Only and Rash     dizziness    Review of Systems  Constitutional: Negative.   HENT: Negative.    Eyes: Negative.   Respiratory: Negative.  Negative for shortness of breath.   Cardiovascular: Negative.  Negative for chest pain.  Gastrointestinal: Negative.  Negative for abdominal pain, constipation and diarrhea.  Genitourinary: Negative.   Musculoskeletal:  Negative for joint pain and myalgias.  Skin: Negative.   Neurological: Negative.  Negative for dizziness and headaches.  Endo/Heme/Allergies: Negative.   All other systems reviewed and are negative.      Objective:   BP 122/70   Pulse 78   Ht 5\' 5"  (1.651 m)   Wt 200 lb 3.2 oz (90.8 kg)   SpO2 95%   BMI 33.32 kg/m   Vitals:   01/10/23 1502  BP: 122/70  Pulse: 78  Height: 5\' 5"  (1.651 m)  Weight: 200 lb 3.2 oz (90.8 kg)  SpO2: 95%  BMI (Calculated): 33.31    Physical Exam Vitals and nursing note reviewed.  Constitutional:      Appearance: Normal appearance. She is normal weight.  HENT:     Head: Normocephalic and atraumatic.     Nose: Nose normal.     Mouth/Throat:     Mouth: Mucous membranes are moist.  Eyes:     Extraocular Movements: Extraocular movements intact.     Conjunctiva/sclera: Conjunctivae normal.     Pupils: Pupils are equal, round, and reactive to light.  Cardiovascular:     Rate and Rhythm: Normal rate and regular rhythm.     Pulses: Normal pulses.     Heart sounds: Normal heart sounds.  Pulmonary:     Effort: Pulmonary effort is normal.     Breath sounds: Normal breath sounds.  Abdominal:     General: Abdomen is flat. Bowel sounds are normal.     Palpations: Abdomen is soft.  Musculoskeletal:        General: Normal range of motion.     Cervical back: Normal range of motion.  Skin:    General: Skin is warm and dry.  Neurological:     General: No focal deficit present.     Mental Status: She is alert and oriented to person, place, and time.  Psychiatric:        Mood and Affect: Mood normal.  Behavior: Behavior normal.        Thought Content: Thought content normal.        Judgment: Judgment normal.      No results found for any visits on 01/10/23.  Recent Results (from the past 2160 hour(s))  Hemoglobin A1c     Status: Abnormal   Collection Time: 12/17/22  9:19 AM  Result Value Ref Range   Hgb A1c MFr Bld 6.2 (H) 4.8 - 5.6 %    Comment:          Prediabetes: 5.7 - 6.4          Diabetes: >6.4          Glycemic control for adults with diabetes: <7.0    Est. average glucose Bld gHb Est-mCnc 131 mg/dL  TSH     Status: None   Collection Time: 12/17/22  9:19 AM  Result Value Ref Range   TSH 1.210 0.450 - 4.500 uIU/mL  CMP14+EGFR     Status: Abnormal   Collection Time: 12/17/22  9:19 AM  Result Value Ref Range   Glucose 105 (H) 70 - 99 mg/dL   BUN 30 (H) 6 - 24 mg/dL   Creatinine, Ser 1.66 0.57 - 1.00 mg/dL   eGFR 67 >06 TK/ZSW/1.09   BUN/Creatinine Ratio 31 (H) 9 - 23   Sodium 140 134 - 144 mmol/L   Potassium 4.6 3.5 - 5.2 mmol/L   Chloride 102 96 - 106 mmol/L   CO2 22 20 - 29 mmol/L   Calcium 9.6 8.7 - 10.2 mg/dL   Total Protein 6.6 6.0 - 8.5 g/dL   Albumin 4.4 3.8 - 4.9 g/dL   Globulin, Total 2.2 1.5 - 4.5 g/dL   Bilirubin Total 0.5 0.0 - 1.2 mg/dL   Alkaline Phosphatase 86 44 - 121 IU/L   AST 18 0 - 40 IU/L   ALT 23 0 - 32 IU/L  Lipid panel     Status: None   Collection Time: 12/17/22  9:19 AM  Result Value Ref Range   Cholesterol, Total 152 100 - 199 mg/dL   Triglycerides 49 0 - 149 mg/dL   HDL 73 >32 mg/dL   VLDL Cholesterol Cal 11 5 - 40 mg/dL   LDL Chol Calc (NIH) 68 0 - 99 mg/dL   Chol/HDL Ratio 2.1 0.0 - 4.4 ratio    Comment:                                   T. Chol/HDL Ratio                                             Men  Women                               1/2 Avg.Risk  3.4    3.3                                   Avg.Risk  5.0    4.4  2X Avg.Risk  9.6    7.1                                3X Avg.Risk 23.4    11.0   CBC With Differential     Status: None   Collection Time: 12/17/22  9:19 AM  Result Value Ref Range   WBC 7.0 3.4 - 10.8 x10E3/uL   RBC 4.87 3.77 - 5.28 x10E6/uL   Hemoglobin 14.4 11.1 - 15.9 g/dL   Hematocrit 16.1 09.6 - 46.6 %   MCV 86 79 - 97 fL   MCH 29.6 26.6 - 33.0 pg   MCHC 34.4 31.5 - 35.7 g/dL   RDW 04.5 40.9 - 81.1 %   Neutrophils 64 Not Estab. %   Lymphs 26 Not Estab. %   Monocytes 7 Not Estab. %   Eos 2 Not Estab. %   Basos 1 Not Estab. %   Neutrophils Absolute 4.5 1.4 - 7.0 x10E3/uL   Lymphocytes Absolute 1.8 0.7 - 3.1 x10E3/uL   Monocytes Absolute 0.5 0.1 - 0.9 x10E3/uL   EOS (ABSOLUTE) 0.2 0.0 - 0.4 x10E3/uL   Basophils Absolute 0.1 0.0 - 0.2 x10E3/uL   Immature Granulocytes 0 Not Estab. %   Immature Grans (Abs) 0.0 0.0 - 0.1 x10E3/uL    Comment: **Effective January 20, 2023, profile [id number] CBC/Differential**   (No Platelet) will be made non-orderable. [facility] Offers:   [id number] CBC With Differential/Platelet       Assessment & Plan:  Continue taking hydroxyzine as needed. Ozempic sent to the pharmacy, may require a PA.  Continue to work on diet and exercise.  Up to date on colonscopy, mammogram and pap smear.   Problem List Items Addressed This Visit       Cardiovascular and Mediastinum   Primary hypertension     Other   Mixed hyperlipidemia   Anxiety - Primary   Relevant Medications   hydrOXYzine (ATARAX) 10 MG tablet   Pre-diabetes   Obesity (BMI 30.0-34.9)    Return in about 6 weeks (around 02/21/2023).   Total time spent: 25 minutes  [employer], NP  01/10/2023   This document may have been prepared by Dragon Voice Recognition software and as such may include unintentional dictation errors.

## 2023-01-16 ENCOUNTER — Other Ambulatory Visit: Payer: Self-pay | Admitting: Nurse Practitioner

## 2023-01-30 ENCOUNTER — Encounter: Payer: Self-pay | Admitting: Cardiology

## 2023-01-30 ENCOUNTER — Other Ambulatory Visit: Payer: Self-pay | Admitting: Cardiology

## 2023-01-30 MED ORDER — NIRMATRELVIR/RITONAVIR (PAXLOVID)TABLET: 3.0000 | ORAL_TABLET | Freq: Two times a day (BID) | ORAL | 0 refills | Status: DC

## 2023-01-30 MED ORDER — NIRMATRELVIR/RITONAVIR (PAXLOVID)TABLET: 3.0000 | ORAL_TABLET | Freq: Two times a day (BID) | ORAL | 0 refills | Status: AC

## 2023-01-30 NOTE — Addendum Note (Signed)
Addended by: Marisue Ivan on: 01/30/2023 02:03 PM   Modules accepted: Orders

## 2023-02-20 ENCOUNTER — Encounter: Payer: Self-pay | Admitting: Cardiology

## 2023-02-20 ENCOUNTER — Ambulatory Visit: Payer: BC Managed Care – PPO | Admitting: Cardiology

## 2023-02-20 VITALS — BP 120/74 | HR 81 | Ht 65.0 in | Wt 191.6 lb

## 2023-02-20 DIAGNOSIS — R7303 Prediabetes: Secondary | ICD-10-CM

## 2023-02-20 DIAGNOSIS — E669 Obesity, unspecified: Secondary | ICD-10-CM | POA: Diagnosis not present

## 2023-02-20 MED ORDER — RYBELSUS 3 MG PO TABS: 3.0000 mg | ORAL_TABLET | Freq: Every day | ORAL | 1 refills | Status: DC

## 2023-02-20 NOTE — Progress Notes (Signed)
Established Patient Office Visit  Subjective:  Patient ID: Dawn Summers, female    DOB: 1965/10/26  Age: 57 y.o. MRN: [id number]  Chief Complaint  Patient presents with   Follow-up    6 week follow up    Patient in office for 6 week follow. Insurance did not approved Ozempic. Patient is prediabetic and obese. Patient would like to try an oral  medication. Will send in Rybelsus. Patient doing well otherwise. If insurance does not approve Rybelsus, patient willing to try phentermine again.     No other concerns at this time.   Past Medical History:  Diagnosis Date   Anxiety    Back pain    Cancer (HCC)    cervical   Carpal tunnel syndrome    Complication of anesthesia    slow to wake up   Family history of adverse reaction to anesthesia    slow to wake up and post op nausea   GERD (gastroesophageal reflux disease)    Headache    History of kidney stones    Hyperlipidemia    Infected hand    Insomnia    Nephrolithiasis    Sleep apnea    Urethral diverticulum    Vitamin D deficiency     Past Surgical History:  Procedure Laterality Date   Bladder "Pack"  2010   BREAST BIOPSY Left 2016   [place] bx benign   CERVICAL SPINE SURGERY     C-3 and C-4 fusion   I & D EXTREMITY Right 06/22/2019   Procedure: IRRIGATION AND DEBRIDEMENT RIGHT HAND FOREIGN BODY;  Surgeon: Deeann Saint, MD;  Location: [facility];  Service: Orthopedics;  Laterality: Right;   VAGINAL HYSTERECTOMY  1994   cervical cancer, ovaries intact    Social History   Socioeconomic History   Marital status: Single    Spouse name: Not on file   Number of children: 2   Years of education: Not on file   Highest education level: Not on file  Occupational History    Employer: [employer]  Tobacco Use   Smoking status: Former    Current packs/day: 0.00    Average packs/day: 0.3 packs/day for 13.0 years (3.2 ttl pk-yrs)    Types: Cigarettes    Start date: 06/28/2006    Quit date: 06/22/2019    Years since  quitting: 3.6   Smokeless tobacco: Never  Vaping Use   Vaping status: Never Used  Substance and Sexual Activity   Alcohol use: Not Currently    Alcohol/week: 0.0 standard drinks of alcohol   Drug use: No   Sexual activity: Not Currently    Birth control/protection: Surgical  Other Topics Concern   Not on file  Social History Narrative   Not on file   Social Determinants of Health   Financial Resource Strain: Not on file  Food Insecurity: Not on file  Transportation Needs: Not on file  Physical Activity: Not on file  Stress: Not on file  Social Connections: Not on file  Intimate Partner Violence: Not on file    Family History  Problem Relation Age of Onset   Diabetes Mother    Lung disease Mother        chronic   Congestive Heart Failure Mother    Coronary artery disease Mother    Stroke Father    Cancer Father        prostate cancer   Hyperlipidemia Father    Hypertension Father    Colon polyps Father  COPD Father    Cancer Sister        lung cancer   Breast cancer Sister    Healthy Daughter    Healthy Daughter     Allergies  Allergen Reactions   Latex     Other reaction(s): Unknown   Penicillins Hives    Did it involve swelling of the face/tongue/throat, SOB, or low BP? No Did it involve sudden or severe rash/hives, skin peeling, or any reaction on the inside of your mouth or nose? Yes Did you need to seek medical attention at a hospital or doctor's office? Yes When did it last happen?     childhood   If all above answers are "NO", may proceed with cephalosporin use.    Trazodone And Nefazodone Nausea Only and Other (See Comments)    dizziness   Sulfa Antibiotics Nausea And Vomiting   Trazodone Nausea Only and Rash    dizziness    Review of Systems  Constitutional: Negative.   HENT: Negative.    Eyes: Negative.   Respiratory: Negative.  Negative for shortness of breath.   Cardiovascular: Negative.  Negative for chest pain.  Gastrointestinal:  Negative.  Negative for abdominal pain, constipation and diarrhea.  Genitourinary: Negative.   Musculoskeletal:  Negative for joint pain and myalgias.  Skin: Negative.   Neurological: Negative.  Negative for dizziness and headaches.  Endo/Heme/Allergies: Negative.   All other systems reviewed and are negative.      Objective:   BP 120/74   Pulse 81   Ht 5\' 5"  (1.651 m)   Wt 191 lb 9.6 oz (86.9 kg)   SpO2 98%   BMI 31.88 kg/m   Vitals:   02/20/23 1045  BP: 120/74  Pulse: 81  Height: 5\' 5"  (1.651 m)  Weight: 191 lb 9.6 oz (86.9 kg)  SpO2: 98%  BMI (Calculated): 31.88    Physical Exam Vitals and nursing note reviewed.  Constitutional:      Appearance: Normal appearance. She is normal weight.  HENT:     Head: Normocephalic and atraumatic.     Nose: Nose normal.     Mouth/Throat:     Mouth: Mucous membranes are moist.  Eyes:     Extraocular Movements: Extraocular movements intact.     Conjunctiva/sclera: Conjunctivae normal.     Pupils: Pupils are equal, round, and reactive to light.  Cardiovascular:     Rate and Rhythm: Normal rate and regular rhythm.     Pulses: Normal pulses.     Heart sounds: Normal heart sounds.  Pulmonary:     Effort: Pulmonary effort is normal.     Breath sounds: Normal breath sounds.  Abdominal:     General: Abdomen is flat. Bowel sounds are normal.     Palpations: Abdomen is soft.  Musculoskeletal:        General: Normal range of motion.     Cervical back: Normal range of motion.  Skin:    General: Skin is warm and dry.  Neurological:     General: No focal deficit present.     Mental Status: She is alert and oriented to person, place, and time.  Psychiatric:        Mood and Affect: Mood normal.        Behavior: Behavior normal.        Thought Content: Thought content normal.        Judgment: Judgment normal.      No results found for any visits on 02/20/23.  Recent Results (from the past 2160 hour(s))  Hemoglobin A1c      Status: Abnormal   Collection Time: 12/17/22  9:19 AM  Result Value Ref Range   Hgb A1c MFr Bld 6.2 (H) 4.8 - 5.6 %    Comment:          Prediabetes: 5.7 - 6.4          Diabetes: >6.4          Glycemic control for adults with diabetes: <7.0    Est. average glucose Bld gHb Est-mCnc 131 mg/dL  TSH     Status: None   Collection Time: 12/17/22  9:19 AM  Result Value Ref Range   TSH 1.210 0.450 - 4.500 uIU/mL  CMP14+EGFR     Status: Abnormal   Collection Time: 12/17/22  9:19 AM  Result Value Ref Range   Glucose 105 (H) 70 - 99 mg/dL   BUN 30 (H) 6 - 24 mg/dL   Creatinine, Ser 1.61 0.57 - 1.00 mg/dL   eGFR 67 >09 UE/AVW/0.98   BUN/Creatinine Ratio 31 (H) 9 - 23   Sodium 140 134 - 144 mmol/L   Potassium 4.6 3.5 - 5.2 mmol/L   Chloride 102 96 - 106 mmol/L   CO2 22 20 - 29 mmol/L   Calcium 9.6 8.7 - 10.2 mg/dL   Total Protein 6.6 6.0 - 8.5 g/dL   Albumin 4.4 3.8 - 4.9 g/dL   Globulin, Total 2.2 1.5 - 4.5 g/dL   Bilirubin Total 0.5 0.0 - 1.2 mg/dL   Alkaline Phosphatase 86 44 - 121 IU/L   AST 18 0 - 40 IU/L   ALT 23 0 - 32 IU/L  Lipid panel     Status: None   Collection Time: 12/17/22  9:19 AM  Result Value Ref Range   Cholesterol, Total 152 100 - 199 mg/dL   Triglycerides 49 0 - 149 mg/dL   HDL 73 >11 mg/dL   VLDL Cholesterol Cal 11 5 - 40 mg/dL   LDL Chol Calc (NIH) 68 0 - 99 mg/dL   Chol/HDL Ratio 2.1 0.0 - 4.4 ratio    Comment:                                   T. Chol/HDL Ratio                                             Men  Women                               1/2 Avg.Risk  3.4    3.3                                   Avg.Risk  5.0    4.4                                2X Avg.Risk  9.6    7.1  3X Avg.Risk 23.4   11.0   CBC With Differential     Status: None   Collection Time: 12/17/22  9:19 AM  Result Value Ref Range   WBC 7.0 3.4 - 10.8 x10E3/uL   RBC 4.87 3.77 - 5.28 x10E6/uL   Hemoglobin 14.4 11.1 - 15.9 g/dL   Hematocrit 32.4 40.1 -  46.6 %   MCV 86 79 - 97 fL   MCH 29.6 26.6 - 33.0 pg   MCHC 34.4 31.5 - 35.7 g/dL   RDW 02.7 25.3 - 66.4 %   Neutrophils 64 Not Estab. %   Lymphs 26 Not Estab. %   Monocytes 7 Not Estab. %   Eos 2 Not Estab. %   Basos 1 Not Estab. %   Neutrophils Absolute 4.5 1.4 - 7.0 x10E3/uL   Lymphocytes Absolute 1.8 0.7 - 3.1 x10E3/uL   Monocytes Absolute 0.5 0.1 - 0.9 x10E3/uL   EOS (ABSOLUTE) 0.2 0.0 - 0.4 x10E3/uL   Basophils Absolute 0.1 0.0 - 0.2 x10E3/uL   Immature Granulocytes 0 Not Estab. %   Immature Grans (Abs) 0.0 0.0 - 0.1 x10E3/uL    Comment: **Effective January 20, 2023, profile [id number] CBC/Differential**   (No Platelet) will be made non-orderable. [facility] Offers:   [id number] CBC With Differential/Platelet       Assessment & Plan:  Rybelsus sent to the pharmacy. Return in 4 weeks after starting the medication. Notify office if insurance does not approve.   Problem List Items Addressed This Visit       Other   Pre-diabetes - Primary   Obesity (BMI 30.0-34.9)    Return in about 6 weeks (around 04/03/2023) for 4 weeks after starting medication.   Total time spent: 25 minutes  Google, NP  02/20/2023   This document may have been prepared by Dragon Voice Recognition software and as such may include unintentional dictation errors.

## 2023-02-21 ENCOUNTER — Ambulatory Visit: Payer: BC Managed Care – PPO | Admitting: Internal Medicine

## 2023-02-25 ENCOUNTER — Other Ambulatory Visit: Payer: Self-pay

## 2023-02-26 ENCOUNTER — Encounter: Payer: Self-pay | Admitting: Cardiology

## 2023-04-03 ENCOUNTER — Other Ambulatory Visit: Payer: Self-pay | Admitting: Internal Medicine

## 2023-04-03 ENCOUNTER — Encounter: Payer: Self-pay | Admitting: Cardiology

## 2023-04-03 ENCOUNTER — Ambulatory Visit: Payer: BC Managed Care – PPO | Admitting: Cardiology

## 2023-04-03 VITALS — BP 129/75 | HR 77 | Ht 65.0 in | Wt 186.2 lb

## 2023-04-03 DIAGNOSIS — E66811 Obesity, class 1: Secondary | ICD-10-CM

## 2023-04-03 DIAGNOSIS — Z1329 Encounter for screening for other suspected endocrine disorder: Secondary | ICD-10-CM

## 2023-04-03 DIAGNOSIS — I10 Essential (primary) hypertension: Secondary | ICD-10-CM

## 2023-04-03 DIAGNOSIS — R7303 Prediabetes: Secondary | ICD-10-CM

## 2023-04-03 DIAGNOSIS — E7800 Pure hypercholesterolemia, unspecified: Secondary | ICD-10-CM | POA: Diagnosis not present

## 2023-04-03 MED ORDER — PHENTERMINE HCL 37.5 MG PO TABS: 37.5000 mg | ORAL_TABLET | Freq: Every day | ORAL | 0 refills | Status: DC

## 2023-04-03 MED ORDER — LINACLOTIDE 72 MCG PO CAPS: 72.0000 ug | ORAL_CAPSULE | Freq: Every day | ORAL | 3 refills | Status: DC

## 2023-04-03 NOTE — Progress Notes (Signed)
Established Patient Office Visit  Subjective:  Patient ID: Dawn Summers, female    DOB: 02-14-1966  Age: 57 y.o. MRN: [id number]  Chief Complaint  Patient presents with   Follow-up    4 week medication f/u    Patient in office for 4 week follow up. Patient did not start Rybelsus, insurance would not cover it. Patient has taken phentermine in the past, would like to try it again. Patient is limited on what exercise she can do due to hip pain for which she is seeing orthopaedics. Patient weight down 5 lbs since previous visit from diet alone.  Patient did not have blood work done, is not fasting today. Will return for fasting lab work.  Patient taking Linzess every other day as needed for constipation.     No other concerns at this time.   Past Medical History:  Diagnosis Date   Anxiety    Back pain    Cancer (HCC)    cervical   Carpal tunnel syndrome    Complication of anesthesia    slow to wake up   Family history of adverse reaction to anesthesia    slow to wake up and post op nausea   GERD (gastroesophageal reflux disease)    Headache    History of kidney stones    Hyperlipidemia    Infected hand    Insomnia    Nephrolithiasis    Sleep apnea    Urethral diverticulum    Vitamin D deficiency     Past Surgical History:  Procedure Laterality Date   Bladder "Pack"  2010   BREAST BIOPSY Left 2016   [place] bx benign   CERVICAL SPINE SURGERY     C-3 and C-4 fusion   I & D EXTREMITY Right 06/22/2019   Procedure: IRRIGATION AND DEBRIDEMENT RIGHT HAND FOREIGN BODY;  Surgeon: Deeann Saint, MD;  Location: [facility];  Service: Orthopedics;  Laterality: Right;   VAGINAL HYSTERECTOMY  1994   cervical cancer, ovaries intact    Social History   Socioeconomic History   Marital status: Single    Spouse name: Not on file   Number of children: 2   Years of education: Not on file   Highest education level: Not on file  Occupational History    Employer: [employer]  Tobacco  Use   Smoking status: Former    Current packs/day: 0.00    Average packs/day: 0.3 packs/day for 13.0 years (3.2 ttl pk-yrs)    Types: Cigarettes    Start date: 06/28/2006    Quit date: 06/22/2019    Years since quitting: 3.7   Smokeless tobacco: Never  Vaping Use   Vaping status: Never Used  Substance and Sexual Activity   Alcohol use: Not Currently    Alcohol/week: 0.0 standard drinks of alcohol   Drug use: No   Sexual activity: Not Currently    Birth control/protection: Surgical  Other Topics Concern   Not on file  Social History Narrative   Not on file   Social Determinants of Health   Financial Resource Strain: Not on file  Food Insecurity: Not on file  Transportation Needs: Not on file  Physical Activity: Not on file  Stress: Not on file  Social Connections: Not on file  Intimate Partner Violence: Not on file    Family History  Problem Relation Age of Onset   Diabetes Mother    Lung disease Mother        chronic   Congestive  Heart Failure Mother    Coronary artery disease Mother    Stroke Father    Cancer Father        prostate cancer   Hyperlipidemia Father    Hypertension Father    Colon polyps Father    COPD Father    Cancer Sister        lung cancer   Breast cancer Sister    Healthy Daughter    Healthy Daughter     Allergies  Allergen Reactions   Latex     Other reaction(s): Unknown   Penicillins Hives    Did it involve swelling of the face/tongue/throat, SOB, or low BP? No Did it involve sudden or severe rash/hives, skin peeling, or any reaction on the inside of your mouth or nose? Yes Did you need to seek medical attention at a hospital or doctor's office? Yes When did it last happen?     childhood   If all above answers are "NO", may proceed with cephalosporin use.    Trazodone And Nefazodone Nausea Only and Other (See Comments)    dizziness   Sulfa Antibiotics Nausea And Vomiting   Trazodone Nausea Only and Rash    dizziness    Review  of Systems  Constitutional: Negative.   HENT: Negative.    Eyes: Negative.   Respiratory: Negative.  Negative for shortness of breath.   Cardiovascular: Negative.  Negative for chest pain.  Gastrointestinal:  Positive for constipation. Negative for abdominal pain and diarrhea.  Genitourinary: Negative.   Musculoskeletal:  Positive for joint pain. Negative for myalgias.  Skin: Negative.   Neurological: Negative.  Negative for dizziness and headaches.  Endo/Heme/Allergies: Negative.   All other systems reviewed and are negative.      Objective:   BP 129/75   Pulse 77   Ht 5\' 5"  (1.651 m)   Wt 186 lb 3.2 oz (84.5 kg)   SpO2 95%   BMI 30.99 kg/m   Vitals:   04/03/23 0936  BP: 129/75  Pulse: 77  Height: 5\' 5"  (1.651 m)  Weight: 186 lb 3.2 oz (84.5 kg)  SpO2: 95%  BMI (Calculated): 30.99    Physical Exam Vitals and nursing note reviewed.  Constitutional:      Appearance: Normal appearance. She is normal weight.  HENT:     Head: Normocephalic and atraumatic.     Nose: Nose normal.     Mouth/Throat:     Mouth: Mucous membranes are moist.  Eyes:     Extraocular Movements: Extraocular movements intact.     Conjunctiva/sclera: Conjunctivae normal.     Pupils: Pupils are equal, round, and reactive to light.  Cardiovascular:     Rate and Rhythm: Normal rate and regular rhythm.     Pulses: Normal pulses.     Heart sounds: Normal heart sounds.  Pulmonary:     Effort: Pulmonary effort is normal.     Breath sounds: Normal breath sounds.  Abdominal:     General: Abdomen is flat. Bowel sounds are normal.     Palpations: Abdomen is soft.  Musculoskeletal:        General: Normal range of motion.     Cervical back: Normal range of motion.  Skin:    General: Skin is warm and dry.  Neurological:     General: No focal deficit present.     Mental Status: She is alert and oriented to person, place, and time.  Psychiatric:        Mood and  Affect: Mood normal.         Behavior: Behavior normal.        Thought Content: Thought content normal.        Judgment: Judgment normal.      No results found for any visits on 04/03/23.  No results found for this or any previous visit (from the past 2160 hour(s)).    Assessment & Plan:  Will get phentermine sent to the pharmacy. Continue to work on diet.  Return for fasting lab work.  Linzess samples given to patient.   Problem List Items Addressed This Visit       Cardiovascular and Mediastinum   Primary hypertension   Relevant Orders   CMP14+EGFR   CBC with Differential/Platelet     Other   Hypercholesterolemia without hypertriglyceridemia   Relevant Orders   Lipid Profile   CMP14+EGFR   CBC with Differential/Platelet   Pre-diabetes - Primary   Relevant Orders   Hemoglobin A1c   CMP14+EGFR   CBC with Differential/Platelet   Obesity (BMI 30.0-34.9)   Relevant Orders   CBC with Differential/Platelet   Other Visit Diagnoses     Thyroid disorder screening       Relevant Orders   TSH   CBC with Differential/Platelet       Return in about 4 weeks (around 05/01/2023).   Total time spent: 25 minutes  Google, NP  04/03/2023   This document may have been prepared by Dragon Voice Recognition software and as such may include unintentional dictation errors.

## 2023-04-14 ENCOUNTER — Other Ambulatory Visit: Payer: Self-pay | Admitting: Family

## 2023-04-28 ENCOUNTER — Other Ambulatory Visit: Payer: BC Managed Care – PPO

## 2023-04-29 LAB — CMP14+EGFR
ALT: 18 [IU]/L (ref 0–32)
AST: 17 [IU]/L (ref 0–40)
Albumin: 4.6 g/dL (ref 3.8–4.9)
Alkaline Phosphatase: 82 [IU]/L (ref 44–121)
BUN/Creatinine Ratio: 28 — ABNORMAL HIGH (ref 9–23)
BUN: 30 mg/dL — ABNORMAL HIGH (ref 6–24)
Bilirubin Total: 0.5 mg/dL (ref 0.0–1.2)
CO2: 23 mmol/L (ref 20–29)
Calcium: 10 mg/dL (ref 8.7–10.2)
Chloride: 97 mmol/L (ref 96–106)
Creatinine, Ser: 1.09 mg/dL — ABNORMAL HIGH (ref 0.57–1.00)
Globulin, Total: 2.4 g/dL (ref 1.5–4.5)
Glucose: 103 mg/dL — ABNORMAL HIGH (ref 70–99)
Potassium: 4.6 mmol/L (ref 3.5–5.2)
Sodium: 139 mmol/L (ref 134–144)
Total Protein: 7 g/dL (ref 6.0–8.5)
eGFR: 59 mL/min/{1.73_m2} — ABNORMAL LOW (ref >59)

## 2023-04-29 LAB — CBC WITH DIFFERENTIAL/PLATELET
Basophils Absolute: 0.1 10*3/uL (ref 0.0–0.2)
Basos: 1 %
EOS (ABSOLUTE): 0.1 10*3/uL (ref 0.0–0.4)
Eos: 2 %
Hematocrit: 46.3 % (ref 34.0–46.6)
Hemoglobin: 15.2 g/dL (ref 11.1–15.9)
Immature Grans (Abs): 0 10*3/uL (ref 0.0–0.1)
Immature Granulocytes: 0 %
Lymphocytes Absolute: 2.1 10*3/uL (ref 0.7–3.1)
Lymphs: 30 %
MCH: 30.3 pg (ref 26.6–33.0)
MCHC: 32.8 g/dL (ref 31.5–35.7)
MCV: 92 fL (ref 79–97)
Monocytes Absolute: 0.5 10*3/uL (ref 0.1–0.9)
Monocytes: 6 %
Neutrophils Absolute: 4.3 10*3/uL (ref 1.4–7.0)
Neutrophils: 61 %
Platelets: 282 10*3/uL (ref 150–450)
RBC: 5.02 x10E6/uL (ref 3.77–5.28)
RDW: 13.3 % (ref 11.7–15.4)
WBC: 7.1 10*3/uL (ref 3.4–10.8)

## 2023-04-29 LAB — LIPID PANEL
Chol/HDL Ratio: 2 {ratio} (ref 0.0–4.4)
Cholesterol, Total: 146 mg/dL (ref 100–199)
HDL: 72 mg/dL (ref >39)
LDL Chol Calc (NIH): 63 mg/dL (ref 0–99)
Triglycerides: 49 mg/dL (ref 0–149)
VLDL Cholesterol Cal: 11 mg/dL (ref 5–40)

## 2023-04-29 LAB — HEMOGLOBIN A1C
Est. average glucose Bld gHb Est-mCnc: 128 mg/dL
Hgb A1c MFr Bld: 6.1 % — ABNORMAL HIGH (ref 4.8–5.6)

## 2023-04-29 LAB — TSH: TSH: 1.17 u[IU]/mL (ref 0.450–4.500)

## 2023-05-05 ENCOUNTER — Ambulatory Visit: Payer: BC Managed Care – PPO | Admitting: Cardiology

## 2023-05-05 ENCOUNTER — Other Ambulatory Visit: Payer: Self-pay | Admitting: Family

## 2023-05-05 ENCOUNTER — Encounter: Payer: Self-pay | Admitting: Cardiology

## 2023-05-05 VITALS — BP 110/78 | HR 89 | Ht 65.0 in | Wt 181.4 lb

## 2023-05-05 DIAGNOSIS — R7303 Prediabetes: Secondary | ICD-10-CM | POA: Diagnosis not present

## 2023-05-05 DIAGNOSIS — I10 Essential (primary) hypertension: Secondary | ICD-10-CM | POA: Diagnosis not present

## 2023-05-05 DIAGNOSIS — E782 Mixed hyperlipidemia: Secondary | ICD-10-CM

## 2023-05-05 DIAGNOSIS — E66811 Obesity, class 1: Secondary | ICD-10-CM | POA: Diagnosis not present

## 2023-05-05 DIAGNOSIS — Z23 Encounter for immunization: Secondary | ICD-10-CM | POA: Diagnosis not present

## 2023-05-05 MED ORDER — MELOXICAM 15 MG PO TABS: 15.0000 mg | ORAL_TABLET | Freq: Every day | ORAL | 3 refills | Status: DC

## 2023-05-05 MED ORDER — OLMESARTAN MEDOXOMIL 20 MG PO TABS: 30.0000 mg | ORAL_TABLET | Freq: Every day | ORAL | 3 refills | Status: DC

## 2023-05-05 MED ORDER — ROSUVASTATIN CALCIUM 20 MG PO TABS: 20.0000 mg | ORAL_TABLET | Freq: Every day | ORAL | 3 refills | Status: DC

## 2023-05-05 MED ORDER — HYDROCHLOROTHIAZIDE 12.5 MG PO TABS: 12.5000 mg | ORAL_TABLET | Freq: Every morning | ORAL | 3 refills | Status: DC

## 2023-05-05 MED ORDER — PANTOPRAZOLE SODIUM 40 MG PO TBEC: 40.0000 mg | DELAYED_RELEASE_TABLET | Freq: Every day | ORAL | 3 refills | Status: DC

## 2023-05-05 MED ORDER — PHENTERMINE HCL 37.5 MG PO TABS: 37.5000 mg | ORAL_TABLET | Freq: Every day | ORAL | 0 refills | Status: DC

## 2023-05-05 NOTE — Progress Notes (Signed)
Established Patient Office Visit  Subjective:  Patient ID: Dawn Summers, female    DOB: Oct 03, 1965  Age: 57 y.o. MRN: [id number]  Chief Complaint  Patient presents with   Follow-up    1 month follow up    Patient in office for one month follow up. Patient doing well, no new complaints today. Scheduled for hip surgery in January. Discussed recent lab work. Hgb A1c improved. LDL at goal.  Patient continues to lose weight on phentermine. Continue diet changes. Patient is limited on what exercise she can do due to hip pain for which she is seeing orthopaedics.    No other concerns at this time.   Past Medical History:  Diagnosis Date   Anxiety    Back pain    Cancer (HCC)    cervical   Carpal tunnel syndrome    Complication of anesthesia    slow to wake up   Family history of adverse reaction to anesthesia    slow to wake up and post op nausea   GERD (gastroesophageal reflux disease)    Headache    History of kidney stones    Hyperlipidemia    Infected hand    Insomnia    Nephrolithiasis    Sleep apnea    Urethral diverticulum    Vitamin D deficiency     Past Surgical History:  Procedure Laterality Date   Bladder "Pack"  2010   BREAST BIOPSY Left 2016   [place] bx benign   CERVICAL SPINE SURGERY     C-3 and C-4 fusion   I & D EXTREMITY Right 06/22/2019   Procedure: IRRIGATION AND DEBRIDEMENT RIGHT HAND FOREIGN BODY;  Surgeon: Deeann Saint, MD;  Location: [facility] ORS;  Service: Orthopedics;  Laterality: Right;   VAGINAL HYSTERECTOMY  1994   cervical cancer, ovaries intact    Social History   Socioeconomic History   Marital status: Single    Spouse name: Not on file   Number of children: 2   Years of education: Not on file   Highest education level: Not on file  Occupational History    Employer: [employer]  Tobacco Use   Smoking status: Former    Current packs/day: 0.00    Average packs/day: 0.3 packs/day for 13.0 years (3.2 ttl pk-yrs)    Types: Cigarettes     Start date: 06/28/2006    Quit date: 06/22/2019    Years since quitting: 3.8   Smokeless tobacco: Never  Vaping Use   Vaping status: Never Used  Substance and Sexual Activity   Alcohol use: Not Currently    Alcohol/week: 0.0 standard drinks of alcohol   Drug use: No   Sexual activity: Not Currently    Birth control/protection: Surgical  Other Topics Concern   Not on file  Social History Narrative   Not on file   Social Determinants of Health   Financial Resource Strain: Not on file  Food Insecurity: Not on file  Transportation Needs: Not on file  Physical Activity: Not on file  Stress: Not on file  Social Connections: Not on file  Intimate Partner Violence: Not on file    Family History  Problem Relation Age of Onset   Diabetes Mother    Lung disease Mother        chronic   Congestive Heart Failure Mother    Coronary artery disease Mother    Stroke Father    Cancer Father        prostate cancer  Hyperlipidemia Father    Hypertension Father    Colon polyps Father    COPD Father    Cancer Sister        lung cancer   Breast cancer Sister    Healthy Daughter    Healthy Daughter     Allergies  Allergen Reactions   Latex     Other reaction(s): Unknown   Penicillins Hives    Did it involve swelling of the face/tongue/throat, SOB, or low BP? No Did it involve sudden or severe rash/hives, skin peeling, or any reaction on the inside of your mouth or nose? Yes Did you need to seek medical attention at a hospital or doctor's office? Yes When did it last happen?     childhood   If all above answers are "NO", may proceed with cephalosporin use.    Trazodone And Nefazodone Nausea Only and Other (See Comments)    dizziness   Sulfa Antibiotics Nausea And Vomiting   Trazodone Nausea Only and Rash    dizziness    Review of Systems  Constitutional: Negative.   HENT: Negative.    Eyes: Negative.   Respiratory: Negative.  Negative for shortness of breath.    Cardiovascular: Negative.  Negative for chest pain.  Gastrointestinal: Negative.  Negative for abdominal pain, constipation and diarrhea.  Genitourinary: Negative.   Musculoskeletal:  Negative for joint pain and myalgias.  Skin: Negative.   Neurological: Negative.  Negative for dizziness and headaches.  Endo/Heme/Allergies: Negative.   All other systems reviewed and are negative.      Objective:   BP 110/78   Pulse 89   Ht 5\' 5"  (1.651 m)   Wt 181 lb 6.4 oz (82.3 kg)   SpO2 96%   BMI 30.19 kg/m   Vitals:   05/05/23 0908  BP: 110/78  Pulse: 89  Height: 5\' 5"  (1.651 m)  Weight: 181 lb 6.4 oz (82.3 kg)  SpO2: 96%  BMI (Calculated): 30.19    Physical Exam Vitals and nursing note reviewed.  Constitutional:      Appearance: Normal appearance. She is normal weight.  HENT:     Head: Normocephalic and atraumatic.     Nose: Nose normal.     Mouth/Throat:     Mouth: Mucous membranes are moist.  Eyes:     Extraocular Movements: Extraocular movements intact.     Conjunctiva/sclera: Conjunctivae normal.     Pupils: Pupils are equal, round, and reactive to light.  Cardiovascular:     Rate and Rhythm: Normal rate and regular rhythm.     Pulses: Normal pulses.     Heart sounds: Normal heart sounds.  Pulmonary:     Effort: Pulmonary effort is normal.     Breath sounds: Normal breath sounds.  Abdominal:     General: Abdomen is flat. Bowel sounds are normal.     Palpations: Abdomen is soft.  Musculoskeletal:        General: Normal range of motion.     Cervical back: Normal range of motion.  Skin:    General: Skin is warm and dry.  Neurological:     General: No focal deficit present.     Mental Status: She is alert and oriented to person, place, and time.  Psychiatric:        Mood and Affect: Mood normal.        Behavior: Behavior normal.        Thought Content: Thought content normal.        Judgment:  Judgment normal.      No results found for any visits on  05/05/23.  Recent Results (from the past 2160 hour(s))  Hemoglobin A1c     Status: Abnormal   Collection Time: 04/28/23  8:59 AM  Result Value Ref Range   Hgb A1c MFr Bld 6.1 (H) 4.8 - 5.6 %    Comment:          Prediabetes: 5.7 - 6.4          Diabetes: >6.4          Glycemic control for adults with diabetes: <7.0    Est. average glucose Bld gHb Est-mCnc 128 mg/dL  Lipid Profile     Status: None   Collection Time: 04/28/23  8:59 AM  Result Value Ref Range   Cholesterol, Total 146 100 - 199 mg/dL   Triglycerides 49 0 - 149 mg/dL   HDL 72 >36 mg/dL   VLDL Cholesterol Cal 11 5 - 40 mg/dL   LDL Chol Calc (NIH) 63 0 - 99 mg/dL   Chol/HDL Ratio 2.0 0.0 - 4.4 ratio    Comment:                                   T. Chol/HDL Ratio                                             Men  Women                               1/2 Avg.Risk  3.4    3.3                                   Avg.Risk  5.0    4.4                                2X Avg.Risk  9.6    7.1                                3X Avg.Risk 23.4   11.0   CMP14+EGFR     Status: Abnormal   Collection Time: 04/28/23  8:59 AM  Result Value Ref Range   Glucose 103 (H) 70 - 99 mg/dL   BUN 30 (H) 6 - 24 mg/dL   Creatinine, Ser 6.44 (H) 0.57 - 1.00 mg/dL   eGFR 59 (L) >03 KV/QQV/9.56   BUN/Creatinine Ratio 28 (H) 9 - 23   Sodium 139 134 - 144 mmol/L   Potassium 4.6 3.5 - 5.2 mmol/L   Chloride 97 96 - 106 mmol/L   CO2 23 20 - 29 mmol/L   Calcium 10.0 8.7 - 10.2 mg/dL   Total Protein 7.0 6.0 - 8.5 g/dL   Albumin 4.6 3.8 - 4.9 g/dL   Globulin, Total 2.4 1.5 - 4.5 g/dL   Bilirubin Total 0.5 0.0 - 1.2 mg/dL   Alkaline Phosphatase 82 44 - 121 IU/L   AST 17 0 - 40 IU/L   ALT 18 0 - 32 IU/L  TSH  Status: None   Collection Time: 04/28/23  8:59 AM  Result Value Ref Range   TSH 1.170 0.450 - 4.500 uIU/mL  CBC with Differential/Platelet     Status: None   Collection Time: 04/28/23  8:59 AM  Result Value Ref Range   WBC 7.1 3.4 - 10.8  x10E3/uL   RBC 5.02 3.77 - 5.28 x10E6/uL   Hemoglobin 15.2 11.1 - 15.9 g/dL   Hematocrit 84.1 32.4 - 46.6 %   MCV 92 79 - 97 fL   MCH 30.3 26.6 - 33.0 pg   MCHC 32.8 31.5 - 35.7 g/dL   RDW 40.1 02.7 - 25.3 %   Platelets 282 150 - 450 x10E3/uL   Neutrophils 61 Not Estab. %   Lymphs 30 Not Estab. %   Monocytes 6 Not Estab. %   Eos 2 Not Estab. %   Basos 1 Not Estab. %   Neutrophils Absolute 4.3 1.4 - 7.0 x10E3/uL   Lymphocytes Absolute 2.1 0.7 - 3.1 x10E3/uL   Monocytes Absolute 0.5 0.1 - 0.9 x10E3/uL   EOS (ABSOLUTE) 0.1 0.0 - 0.4 x10E3/uL   Basophils Absolute 0.1 0.0 - 0.2 x10E3/uL   Immature Granulocytes 0 Not Estab. %   Immature Grans (Abs) 0.0 0.0 - 0.1 x10E3/uL      Assessment & Plan:  Continue strict diabetic diet, exercise as tolerated.   Problem List Items Addressed This Visit       Cardiovascular and Mediastinum   Primary hypertension - Primary   Relevant Medications   hydrochlorothiazide (HYDRODIURIL) 12.5 MG tablet   olmesartan (BENICAR) 20 MG tablet   rosuvastatin (CRESTOR) 20 MG tablet     Other   Mixed hyperlipidemia   Relevant Medications   hydrochlorothiazide (HYDRODIURIL) 12.5 MG tablet   olmesartan (BENICAR) 20 MG tablet   rosuvastatin (CRESTOR) 20 MG tablet   Pre-diabetes   Obesity (BMI 30.0-34.9)    Return in about 3 months (around 08/05/2023).   Total time spent: 25 minutes  Google, NP  05/05/2023   This document may have been prepared by Dragon Voice Recognition software and as such may include unintentional dictation errors.

## 2023-07-08 ENCOUNTER — Telehealth: Payer: Self-pay

## 2023-07-08 NOTE — Telephone Encounter (Signed)
 Patient left another VM that her BP is 75/49. Advise what to do.

## 2023-07-08 NOTE — Telephone Encounter (Signed)
 Pt had surgery 06/30/23- bp has been running low, she wants to know if she should or should not be taking her BP medication

## 2023-07-10 ENCOUNTER — Telehealth: Payer: Self-pay | Admitting: Cardiology

## 2023-07-10 NOTE — Telephone Encounter (Signed)
Patient left another VM about her BP being low. This time it is 82/49. She has been holding her BP meds and would like some insight on what to do about her BP. Please advise.

## 2023-08-05 ENCOUNTER — Ambulatory Visit: Payer: BC Managed Care – PPO | Admitting: Cardiology

## 2023-08-13 ENCOUNTER — Ambulatory Visit: Payer: BC Managed Care – PPO | Admitting: Cardiology

## 2023-08-19 ENCOUNTER — Ambulatory Visit: Payer: BC Managed Care – PPO | Admitting: Cardiology

## 2023-08-26 ENCOUNTER — Ambulatory Visit (INDEPENDENT_AMBULATORY_CARE_PROVIDER_SITE_OTHER): Payer: BC Managed Care – PPO | Admitting: Cardiology

## 2023-08-26 ENCOUNTER — Encounter: Payer: Self-pay | Admitting: Cardiology

## 2023-08-26 ENCOUNTER — Other Ambulatory Visit

## 2023-08-26 VITALS — BP 136/76 | HR 66 | Ht 65.0 in | Wt 189.0 lb

## 2023-08-26 DIAGNOSIS — R7303 Prediabetes: Secondary | ICD-10-CM

## 2023-08-26 DIAGNOSIS — E782 Mixed hyperlipidemia: Secondary | ICD-10-CM | POA: Diagnosis not present

## 2023-08-26 DIAGNOSIS — I10 Essential (primary) hypertension: Secondary | ICD-10-CM

## 2023-08-26 DIAGNOSIS — Z1329 Encounter for screening for other suspected endocrine disorder: Secondary | ICD-10-CM

## 2023-08-26 DIAGNOSIS — Z1231 Encounter for screening mammogram for malignant neoplasm of breast: Secondary | ICD-10-CM

## 2023-08-26 MED ORDER — TRULANCE 3 MG PO TABS: 1.0000 | ORAL_TABLET | Freq: Every day | ORAL | 4 refills | Status: AC

## 2023-08-26 NOTE — Progress Notes (Signed)
 Established Patient Office Visit  Subjective:  Patient ID: Dawn Summers, female    DOB: [birth year]  Age: 58 y.o. MRN: [id number]  Chief Complaint  Patient presents with   Follow-up    3 Months Follow Up    Patient in office for 3 month follow up. Patient doing well, no new complaints today. Patient had fasting lab work done this morning, will call with results.  Patient recovering from hip surgery. Rehab completed. Due for mammogram in April, will send order now.  Patient states insurance will not cover Linzess, will try Trulance.     No other concerns at this time.   Past Medical History:  Diagnosis Date   Anxiety    Back pain    Cancer (HCC)    cervical   Carpal tunnel syndrome    Complication of anesthesia    slow to wake up   Family history of adverse reaction to anesthesia    slow to wake up and post op nausea   GERD (gastroesophageal reflux disease)    Headache    History of kidney stones    Hyperlipidemia    Infected hand    Insomnia    Nephrolithiasis    Sleep apnea    Urethral diverticulum    Vitamin D deficiency     Past Surgical History:  Procedure Laterality Date   Bladder "Pack"  2010   BREAST BIOPSY Left 2016   [place] bx benign   CERVICAL SPINE SURGERY     C-3 and C-4 fusion   I & D EXTREMITY Right 06/22/2019   Procedure: IRRIGATION AND DEBRIDEMENT RIGHT HAND FOREIGN BODY;  Surgeon: Deeann Saint, MD;  Location: [facility];  Service: Orthopedics;  Laterality: Right;   VAGINAL HYSTERECTOMY  1994   cervical cancer, ovaries intact    Social History   Socioeconomic History   Marital status: Single    Spouse name: Not on file   Number of children: 2   Years of education: Not on file   Highest education level: Not on file  Occupational History    Employer: [employer]  Tobacco Use   Smoking status: Former    Current packs/day: 0.00    Average packs/day: 0.3 packs/day for 13.0 years (3.2 ttl pk-yrs)    Types: Cigarettes    Start date:  06/28/2006    Quit date: 06/22/2019    Years since quitting: 4.1   Smokeless tobacco: Never  Vaping Use   Vaping status: Never Used  Substance and Sexual Activity   Alcohol use: Not Currently    Alcohol/week: 0.0 standard drinks of alcohol   Drug use: No   Sexual activity: Not Currently    Birth control/protection: Surgical  Other Topics Concern   Not on file  Social History Narrative   Not on file   Social Drivers of Health   Financial Resource Strain: Not on file  Food Insecurity: Not on file  Transportation Needs: Not on file  Physical Activity: Not on file  Stress: Not on file  Social Connections: Not on file  Intimate Partner Violence: Not on file    Family History  Problem Relation Age of Onset   Diabetes Mother    Lung disease Mother        chronic   Congestive Heart Failure Mother    Coronary artery disease Mother    Stroke Father    Cancer Father        prostate cancer   Hyperlipidemia Father  Hypertension Father    Colon polyps Father    COPD Father    Cancer Sister        lung cancer   Breast cancer Sister    Healthy Daughter    Healthy Daughter     Allergies  Allergen Reactions   Latex     Other reaction(s): Unknown   Penicillins Hives    Did it involve swelling of the face/tongue/throat, SOB, or low BP? No Did it involve sudden or severe rash/hives, skin peeling, or any reaction on the inside of your mouth or nose? Yes Did you need to seek medical attention at a hospital or doctor's office? Yes When did it last happen?     childhood   If all above answers are "NO", may proceed with cephalosporin use.    Trazodone And Nefazodone Nausea Only and Other (See Comments)    dizziness   Sulfa Antibiotics Nausea And Vomiting   Trazodone Nausea Only and Rash    dizziness    Outpatient Medications Prior to Visit  Medication Sig   hydrochlorothiazide (HYDRODIURIL) 12.5 MG tablet Take 1 tablet (12.5 mg total) by mouth every morning.   meloxicam  (MOBIC) 15 MG tablet Take 1 tablet (15 mg total) by mouth daily.   olmesartan (BENICAR) 20 MG tablet Take 1.5 tablets (30 mg total) by mouth daily.   pantoprazole (PROTONIX) 40 MG tablet Take 1 tablet (40 mg total) by mouth daily.   phentermine (ADIPEX-P) 37.5 MG tablet Take 1 tablet (37.5 mg total) by mouth daily before breakfast.   rosuvastatin (CRESTOR) 20 MG tablet Take 1 tablet (20 mg total) by mouth at bedtime.   [DISCONTINUED] linaclotide (LINZESS) 72 MCG capsule Take 1 capsule (72 mcg total) by mouth daily before breakfast.   No facility-administered medications prior to visit.    Review of Systems  Constitutional: Negative.   HENT: Negative.    Eyes: Negative.   Respiratory: Negative.  Negative for shortness of breath.   Cardiovascular: Negative.  Negative for chest pain.  Gastrointestinal: Negative.  Negative for abdominal pain, constipation and diarrhea.  Genitourinary: Negative.   Musculoskeletal:  Negative for joint pain and myalgias.  Skin: Negative.   Neurological: Negative.  Negative for dizziness and headaches.  Endo/Heme/Allergies: Negative.   All other systems reviewed and are negative.      Objective:   BP 136/76   Pulse 66   Ht 5\' 5"  (1.651 m)   Wt 189 lb (85.7 kg)   SpO2 97%   BMI 31.45 kg/m   Vitals:   08/26/23 0931  BP: 136/76  Pulse: 66  Height: 5\' 5"  (1.651 m)  Weight: 189 lb (85.7 kg)  SpO2: 97%  BMI (Calculated): 31.45    Physical Exam Vitals and nursing note reviewed.  Constitutional:      Appearance: Normal appearance. She is normal weight.  HENT:     Head: Normocephalic and atraumatic.     Nose: Nose normal.     Mouth/Throat:     Mouth: Mucous membranes are moist.  Eyes:     Extraocular Movements: Extraocular movements intact.     Conjunctiva/sclera: Conjunctivae normal.     Pupils: Pupils are equal, round, and reactive to light.  Cardiovascular:     Rate and Rhythm: Normal rate and regular rhythm.     Pulses: Normal pulses.      Heart sounds: Normal heart sounds.  Pulmonary:     Effort: Pulmonary effort is normal.     Breath sounds: Normal  breath sounds.  Abdominal:     General: Abdomen is flat. Bowel sounds are normal.     Palpations: Abdomen is soft.  Musculoskeletal:        General: Normal range of motion.     Cervical back: Normal range of motion.  Skin:    General: Skin is warm and dry.  Neurological:     General: No focal deficit present.     Mental Status: She is alert and oriented to person, place, and time.  Psychiatric:        Mood and Affect: Mood normal.        Behavior: Behavior normal.        Thought Content: Thought content normal.        Judgment: Judgment normal.      No results found for any visits on 08/26/23.  No results found for this or any previous visit (from the past 2160 hours).    Assessment & Plan:  Mammogram order placed.  Will call with results of fasting lab work.  Change Linzess to Trulance.   Problem List Items Addressed This Visit       Cardiovascular and Mediastinum   Primary hypertension - Primary   Relevant Orders   CBC with Differential/Platelet   CMP14+EGFR   Lipid panel   Hemoglobin A1c   TSH     Other   Mixed hyperlipidemia   Relevant Orders   Lipid panel   Pre-diabetes   Relevant Orders   Hemoglobin A1c   Other Visit Diagnoses       Thyroid disorder screening       Relevant Orders   TSH     Breast cancer screening by mammogram       Relevant Orders   MM 3D SCREENING MAMMOGRAM BILATERAL BREAST       Return in about 3 months (around 11/26/2023).   Total time spent: 25 minutes  Google, NP  08/26/2023   This document may have been prepared by Dragon Voice Recognition software and as such may include unintentional dictation errors.

## 2023-08-27 LAB — CBC WITH DIFFERENTIAL/PLATELET
Basophils Absolute: 0.1 10*3/uL (ref 0.0–0.2)
Basos: 1 %
EOS (ABSOLUTE): 0.2 10*3/uL (ref 0.0–0.4)
Eos: 3 %
Hematocrit: 39.4 % (ref 34.0–46.6)
Hemoglobin: 12.6 g/dL (ref 11.1–15.9)
Immature Grans (Abs): 0.1 10*3/uL (ref 0.0–0.1)
Immature Granulocytes: 1 %
Lymphocytes Absolute: 2.5 10*3/uL (ref 0.7–3.1)
Lymphs: 34 %
MCH: 28.8 pg (ref 26.6–33.0)
MCHC: 32 g/dL (ref 31.5–35.7)
MCV: 90 fL (ref 79–97)
Monocytes Absolute: 0.7 10*3/uL (ref 0.1–0.9)
Monocytes: 9 %
Neutrophils Absolute: 3.9 10*3/uL (ref 1.4–7.0)
Neutrophils: 52 %
Platelets: 323 10*3/uL (ref 150–450)
RBC: 4.38 x10E6/uL (ref 3.77–5.28)
RDW: 13 % (ref 11.7–15.4)
WBC: 7.3 10*3/uL (ref 3.4–10.8)

## 2023-08-27 LAB — CMP14+EGFR
ALT: 20 IU/L (ref 0–32)
AST: 20 IU/L (ref 0–40)
Albumin: 4.4 g/dL (ref 3.8–4.9)
Alkaline Phosphatase: 108 IU/L (ref 44–121)
BUN/Creatinine Ratio: 21 (ref 9–23)
BUN: 18 mg/dL (ref 6–24)
Bilirubin Total: 0.3 mg/dL (ref 0.0–1.2)
CO2: 25 mmol/L (ref 20–29)
Calcium: 9.4 mg/dL (ref 8.7–10.2)
Chloride: 103 mmol/L (ref 96–106)
Creatinine, Ser: 0.86 mg/dL (ref 0.57–1.00)
Globulin, Total: 2.4 g/dL (ref 1.5–4.5)
Glucose: 86 mg/dL (ref 70–99)
Potassium: 4.7 mmol/L (ref 3.5–5.2)
Sodium: 140 mmol/L (ref 134–144)
Total Protein: 6.8 g/dL (ref 6.0–8.5)
eGFR: 78 mL/min/{1.73_m2} (ref >59)

## 2023-08-27 LAB — LIPID PANEL
Chol/HDL Ratio: 2 ratio (ref 0.0–4.4)
Cholesterol, Total: 157 mg/dL (ref 100–199)
HDL: 79 mg/dL (ref >39)
LDL Chol Calc (NIH): 64 mg/dL (ref 0–99)
Triglycerides: 73 mg/dL (ref 0–149)
VLDL Cholesterol Cal: 14 mg/dL (ref 5–40)

## 2023-08-27 LAB — HEMOGLOBIN A1C
Est. average glucose Bld gHb Est-mCnc: 108 mg/dL
Hgb A1c MFr Bld: 5.4 % (ref 4.8–5.6)

## 2023-08-27 LAB — TSH: TSH: 1.47 u[IU]/mL (ref 0.450–4.500)

## 2023-08-29 ENCOUNTER — Other Ambulatory Visit: Payer: Self-pay | Admitting: Family

## 2023-08-29 DIAGNOSIS — E66811 Obesity, class 1: Secondary | ICD-10-CM

## 2023-10-06 ENCOUNTER — Ambulatory Visit
Admission: RE | Admit: 2023-10-06 | Discharge: 2023-10-06 | Disposition: A | Discharge status: Home / Self Care | Source: Ambulatory Visit | Attending: Cardiology | Admitting: Cardiology

## 2023-10-06 DIAGNOSIS — Z1231 Encounter for screening mammogram for malignant neoplasm of breast: Secondary | ICD-10-CM | POA: Diagnosis present

## 2023-11-19 ENCOUNTER — Telehealth: Payer: Self-pay | Admitting: Cardiology

## 2023-11-19 NOTE — Telephone Encounter (Signed)
 Patient left VM requesting a Z-pak be sent in for her. States she tested negative for Covid and flu and her allergies are just really bothering her. Please advise.

## 2023-11-25 ENCOUNTER — Ambulatory Visit (INDEPENDENT_AMBULATORY_CARE_PROVIDER_SITE_OTHER): Admitting: Cardiology

## 2023-11-25 ENCOUNTER — Other Ambulatory Visit: Payer: Self-pay | Admitting: Internal Medicine

## 2023-11-25 ENCOUNTER — Encounter: Payer: Self-pay | Admitting: Cardiology

## 2023-11-25 VITALS — BP 126/80 | HR 78 | Ht 65.0 in | Wt 192.4 lb

## 2023-11-25 DIAGNOSIS — E66811 Obesity, class 1: Secondary | ICD-10-CM

## 2023-11-25 DIAGNOSIS — R7303 Prediabetes: Secondary | ICD-10-CM

## 2023-11-25 DIAGNOSIS — I10 Essential (primary) hypertension: Secondary | ICD-10-CM

## 2023-11-25 DIAGNOSIS — Z1329 Encounter for screening for other suspected endocrine disorder: Secondary | ICD-10-CM

## 2023-11-25 DIAGNOSIS — E782 Mixed hyperlipidemia: Secondary | ICD-10-CM | POA: Diagnosis not present

## 2023-11-25 DIAGNOSIS — Z713 Dietary counseling and surveillance: Secondary | ICD-10-CM

## 2023-11-25 MED ORDER — PANTOPRAZOLE SODIUM 40 MG PO TBEC: 40.0000 mg | DELAYED_RELEASE_TABLET | Freq: Every day | ORAL | 3 refills | Status: AC

## 2023-11-25 MED ORDER — PHENTERMINE HCL 37.5 MG PO TABS: 37.5000 mg | ORAL_TABLET | Freq: Every day | ORAL | 0 refills | Status: DC

## 2023-11-25 NOTE — Progress Notes (Signed)
 Established Patient Office Visit  Subjective:  Patient ID: Dawn Summers, female    DOB: [birth year]  Age: 58 y.o. MRN: [id number]  Chief Complaint  Patient presents with   Follow-up    3 month followup    Patient in office for a three month follow up, did not have blood work done.  Patient complaining of lower extremity edema bilateral since hip surgery. Recommend using compression hose.  Complaining of sinus drainage, cough, PND. Drainage clear. Recommend using Mucinex-D.  Return for fasting lab work, will call with results.  Patient reports last pap smear 2024.  Patient requesting a refill on phentermine . Patient understands diet and exercise changes are needed for long term weight loss success. The patient is asked to make an attempt to improve diet and exercise patterns to aid in medical management of this problem.    No other concerns at this time.   Past Medical History:  Diagnosis Date   Anxiety    Back pain    Cancer (HCC)    cervical   Carpal tunnel syndrome    Complication of anesthesia    slow to wake up   Family history of adverse reaction to anesthesia    slow to wake up and post op nausea   GERD (gastroesophageal reflux disease)    Headache    History of kidney stones    Hyperlipidemia    Infected hand    Insomnia    Nephrolithiasis    Sleep apnea    Urethral diverticulum    Vitamin D deficiency     Past Surgical History:  Procedure Laterality Date   Bladder "Pack"  2010   BREAST BIOPSY Left 2016   US  bx benign   CERVICAL SPINE SURGERY     C-3 and C-4 fusion   I & D EXTREMITY Right 06/22/2019   Procedure: IRRIGATION AND DEBRIDEMENT RIGHT HAND FOREIGN BODY;  Surgeon: Marlynn Singer, MD;  Location: [facility];  Service: Orthopedics;  Laterality: Right;   VAGINAL HYSTERECTOMY  1994   cervical cancer, ovaries intact    Social History   Socioeconomic History   Marital status: Single    Spouse name: Not on file   Number of children: 2   Years of  education: Not on file   Highest education level: Not on file  Occupational History    Employer: [employer]  Tobacco Use   Smoking status: Former    Current packs/day: 0.00    Average packs/day: 0.3 packs/day for 13.0 years (3.2 ttl pk-yrs)    Types: Cigarettes    Start date: 06/28/2006    Quit date: 06/22/2019    Years since quitting: 4.4   Smokeless tobacco: Never  Vaping Use   Vaping status: Never Used  Substance and Sexual Activity   Alcohol use: Not Currently    Alcohol/week: 0.0 standard drinks of alcohol   Drug use: No   Sexual activity: Not Currently    Birth control/protection: Surgical  Other Topics Concern   Not on file  Social History Narrative   Not on file   Social Drivers of Health   Financial Resource Strain: Not on file  Food Insecurity: Not on file  Transportation Needs: Not on file  Physical Activity: Not on file  Stress: Not on file  Social Connections: Not on file  Intimate Partner Violence: Not on file    Family History  Problem Relation Age of Onset   Diabetes Mother    Lung disease Mother  chronic   Congestive Heart Failure Mother    Coronary artery disease Mother    Stroke Father    Cancer Father        prostate cancer   Hyperlipidemia Father    Hypertension Father    Colon polyps Father    COPD Father    Cancer Sister        lung cancer   Breast cancer Sister    Healthy Daughter    Healthy Daughter     Allergies  Allergen Reactions   Latex     Other reaction(s): Unknown   Penicillins Hives    Did it involve swelling of the face/tongue/throat, SOB, or low BP? No Did it involve sudden or severe rash/hives, skin peeling, or any reaction on the inside of your mouth or nose? Yes Did you need to seek medical attention at a hospital or doctor's office? Yes When did it last happen?     childhood   If all above answers are "NO", may proceed with cephalosporin use.    Trazodone And Nefazodone Nausea Only and Other (See  Comments)    dizziness   Sulfa Antibiotics Nausea And Vomiting   Trazodone Nausea Only and Rash    dizziness    Outpatient Medications Prior to Visit  Medication Sig   hydrochlorothiazide  (HYDRODIURIL ) 12.5 MG tablet Take 1 tablet (12.5 mg total) by mouth every morning.   meloxicam  (MOBIC ) 15 MG tablet Take 1 tablet (15 mg total) by mouth daily.   olmesartan  (BENICAR ) 20 MG tablet Take 1.5 tablets (30 mg total) by mouth daily.   Plecanatide  (TRULANCE ) 3 MG TABS Take 1 tablet (3 mg total) by mouth daily.   rosuvastatin  (CRESTOR ) 20 MG tablet Take 1 tablet (20 mg total) by mouth at bedtime.   [DISCONTINUED] pantoprazole  (PROTONIX ) 40 MG tablet Take 1 tablet (40 mg total) by mouth daily.   [DISCONTINUED] phentermine  (ADIPEX-P ) 37.5 MG tablet TAKE 1 TABLET(37.5 MG) BY MOUTH DAILY BEFORE BREAKFAST (Patient not taking: Reported on 11/25/2023)   No facility-administered medications prior to visit.    Review of Systems  Constitutional: Negative.   HENT: Negative.    Eyes: Negative.   Respiratory: Negative.  Negative for shortness of breath.   Cardiovascular: Negative.  Negative for chest pain.  Gastrointestinal: Negative.  Negative for abdominal pain, constipation and diarrhea.  Genitourinary: Negative.   Musculoskeletal:  Negative for joint pain and myalgias.  Skin: Negative.   Neurological: Negative.  Negative for dizziness and headaches.  Endo/Heme/Allergies: Negative.   All other systems reviewed and are negative.      Objective:   BP 126/80 (BP Location: Right Arm, Patient Position: Sitting, Cuff Size: Large)   Pulse 78   Ht 5\' 5"  (1.651 m)   Wt 192 lb 6.4 oz (87.3 kg)   SpO2 98%   BMI 32.02 kg/m   Vitals:   11/25/23 0842 11/25/23 0910  BP: (!) 147/83 126/80  Pulse: 78   Height: 5\' 5"  (1.651 m)   Weight: 192 lb 6.4 oz (87.3 kg)   SpO2: 98%   BMI (Calculated): 32.02     Physical Exam Vitals and nursing note reviewed.  Constitutional:      Appearance: Normal  appearance. She is normal weight.  HENT:     Head: Normocephalic and atraumatic.     Nose: Nose normal.     Mouth/Throat:     Mouth: Mucous membranes are moist.  Eyes:     Extraocular Movements: Extraocular movements intact.  Conjunctiva/sclera: Conjunctivae normal.     Pupils: Pupils are equal, round, and reactive to light.  Cardiovascular:     Rate and Rhythm: Normal rate and regular rhythm.     Pulses: Normal pulses.     Heart sounds: Normal heart sounds.  Pulmonary:     Effort: Pulmonary effort is normal.     Breath sounds: Normal breath sounds.  Abdominal:     General: Abdomen is flat. Bowel sounds are normal.     Palpations: Abdomen is soft.  Musculoskeletal:        General: Normal range of motion.     Cervical back: Normal range of motion.  Skin:    General: Skin is warm and dry.  Neurological:     General: No focal deficit present.     Mental Status: She is alert and oriented to person, place, and time.  Psychiatric:        Mood and Affect: Mood normal.        Behavior: Behavior normal.        Thought Content: Thought content normal.        Judgment: Judgment normal.      No results found for any visits on 11/25/23.  No results found for this or any previous visit (from the past 2160 hours).    Assessment & Plan:  Compression hose for edema Mucinex D for sinus drainage Return for fasting lab work, will call with results Phentermine  refill Diet and exercise for long term weight loss success  Problem List Items Addressed This Visit       Cardiovascular and Mediastinum   Primary hypertension - Primary   Relevant Orders   CMP14+EGFR     Other   Weight loss counseling, encounter for   Mixed hyperlipidemia   Relevant Orders   Lipid panel   Pre-diabetes   Relevant Orders   CMP14+EGFR   Hemoglobin A1c   Obesity (BMI 30.0-34.9)   Other Visit Diagnoses       Thyroid disorder screening       Relevant Orders   TSH       Return in about 4  months (around 03/26/2024) for fasting labs prior.   Total time spent: 25 minutes  Google, NP  11/25/2023   This document may have been prepared by Dragon Voice Recognition software and as such may include unintentional dictation errors.

## 2023-11-28 ENCOUNTER — Ambulatory Visit: Admitting: Cardiology

## 2024-01-14 ENCOUNTER — Other Ambulatory Visit: Payer: Self-pay

## 2024-01-14 DIAGNOSIS — E66811 Obesity, class 1: Secondary | ICD-10-CM

## 2024-01-14 NOTE — Telephone Encounter (Signed)
 Pt called and left vm requesting refill, please advise

## 2024-01-26 ENCOUNTER — Other Ambulatory Visit: Payer: Self-pay | Admitting: Internal Medicine

## 2024-01-26 DIAGNOSIS — E66811 Obesity, class 1: Secondary | ICD-10-CM

## 2024-02-10 ENCOUNTER — Encounter: Payer: Self-pay | Admitting: Cardiology

## 2024-02-10 ENCOUNTER — Ambulatory Visit: Admitting: Cardiology

## 2024-02-10 ENCOUNTER — Ambulatory Visit: Payer: Self-pay | Admitting: Cardiology

## 2024-02-10 VITALS — BP 122/74 | HR 70 | Ht 65.0 in | Wt 191.0 lb

## 2024-02-10 DIAGNOSIS — R109 Unspecified abdominal pain: Secondary | ICD-10-CM

## 2024-02-10 DIAGNOSIS — N200 Calculus of kidney: Secondary | ICD-10-CM

## 2024-02-10 DIAGNOSIS — Z0130 Encounter for examination of blood pressure without abnormal findings: Secondary | ICD-10-CM

## 2024-02-10 LAB — POCT URINALYSIS DIPSTICK
Bilirubin, UA: NEGATIVE
Blood, UA: NEGATIVE
Glucose, UA: NEGATIVE
Ketones, UA: NEGATIVE
Leukocytes, UA: NEGATIVE
Nitrite, UA: NEGATIVE
Protein, UA: NEGATIVE
Spec Grav, UA: >=1.03 — AB (ref 1.010–1.025)
Urobilinogen, UA: 0.2 U/dL
pH, UA: 5.5 (ref 5.0–8.0)

## 2024-02-10 MED ORDER — TAMSULOSIN HCL 0.4 MG PO CAPS: 0.4000 mg | ORAL_CAPSULE | Freq: Every day | ORAL | 0 refills | Status: DC

## 2024-02-10 MED ORDER — NITROFURANTOIN MONOHYD MACRO 100 MG PO CAPS: 100.0000 mg | ORAL_CAPSULE | Freq: Two times a day (BID) | ORAL | 0 refills | Status: AC

## 2024-02-10 MED ORDER — KETOROLAC TROMETHAMINE 10 MG PO TABS: 10.0000 mg | ORAL_TABLET | Freq: Four times a day (QID) | ORAL | 0 refills | Status: DC | PRN

## 2024-02-10 NOTE — Progress Notes (Unsigned)
 Established Patient Office Visit  Subjective:  Patient ID: Dawn Summers, female    DOB: [birth year]  Age: 58 y.o. MRN: [id number]  Chief Complaint  Patient presents with   Acute Visit    Possible Kidney Stones    Patient in office for an acute visit, complaining of back pain, possible kidney stones. Lower back pain for a couple weeks. Urine has been dark, feeling tired. Known kidney stone, unable to be removed due to location per urology.  Will send in Macrobid  and Flomax . Toradol  for pain. UA unremarkable, will send for culture. Will refer to urology for further management.     No other concerns at this time.   Past Medical History:  Diagnosis Date   Anxiety    Back pain    Cancer (HCC)    cervical   Carpal tunnel syndrome    Complication of anesthesia    slow to wake up   Family history of adverse reaction to anesthesia    slow to wake up and post op nausea   GERD (gastroesophageal reflux disease)    Headache    History of kidney stones    Hyperlipidemia    Infected hand    Insomnia    Nephrolithiasis    Sleep apnea    Urethral diverticulum    Vitamin D deficiency     Past Surgical History:  Procedure Laterality Date   Bladder Pack  2010   BREAST BIOPSY Left 2016   US  bx benign   CERVICAL SPINE SURGERY     C-3 and C-4 fusion   I & D EXTREMITY Right 06/22/2019   Procedure: IRRIGATION AND DEBRIDEMENT RIGHT HAND FOREIGN BODY;  Surgeon: Cleotilde Barrio, MD;  Location: [facility];  Service: Orthopedics;  Laterality: Right;   VAGINAL HYSTERECTOMY  1994   cervical cancer, ovaries intact    Social History   Socioeconomic History   Marital status: Single    Spouse name: Not on file   Number of children: 2   Years of education: Not on file   Highest education level: Not on file  Occupational History    Employer: [employer]  Tobacco Use   Smoking status: Former    Current packs/day: 0.00    Average packs/day: 0.3 packs/day for 13.0 years (3.2 ttl pk-yrs)     Types: Cigarettes    Start date: 06/28/2006    Quit date: 06/22/2019    Years since quitting: 4.6   Smokeless tobacco: Never  Vaping Use   Vaping status: Never Used  Substance and Sexual Activity   Alcohol use: Not Currently    Alcohol/week: 0.0 standard drinks of alcohol   Drug use: No   Sexual activity: Not Currently    Birth control/protection: Surgical  Other Topics Concern   Not on file  Social History Narrative   Not on file   Social Drivers of Health   Financial Resource Strain: Not on file  Food Insecurity: Not on file  Transportation Needs: Not on file  Physical Activity: Not on file  Stress: Not on file  Social Connections: Not on file  Intimate Partner Violence: Not on file    Family History  Problem Relation Age of Onset   Diabetes Mother    Lung disease Mother        chronic   Congestive Heart Failure Mother    Coronary artery disease Mother    Stroke Father    Cancer Father        prostate  cancer   Hyperlipidemia Father    Hypertension Father    Colon polyps Father    COPD Father    Cancer Sister        lung cancer   Breast cancer Sister    Healthy Daughter    Healthy Daughter     Allergies  Allergen Reactions   Latex     Other reaction(s): Unknown   Penicillins Hives    Did it involve swelling of the face/tongue/throat, SOB, or low BP? No Did it involve sudden or severe rash/hives, skin peeling, or any reaction on the inside of your mouth or nose? Yes Did you need to seek medical attention at a hospital or doctor's office? Yes When did it last happen?     childhood   If all above answers are "NO", may proceed with cephalosporin use.    Trazodone And Nefazodone Nausea Only and Other (See Comments)    dizziness   Sulfa Antibiotics Nausea And Vomiting   Trazodone Nausea Only and Rash    dizziness    Outpatient Medications Prior to Visit  Medication Sig   hydrochlorothiazide  (HYDRODIURIL ) 12.5 MG tablet Take 1 tablet (12.5 mg total) by  mouth every morning.   meloxicam  (MOBIC ) 15 MG tablet Take 1 tablet (15 mg total) by mouth daily.   olmesartan  (BENICAR ) 20 MG tablet Take 1.5 tablets (30 mg total) by mouth daily.   pantoprazole  (PROTONIX ) 40 MG tablet Take 1 tablet (40 mg total) by mouth daily.   Plecanatide  (TRULANCE ) 3 MG TABS Take 1 tablet (3 mg total) by mouth daily.   rosuvastatin  (CRESTOR ) 20 MG tablet Take 1 tablet (20 mg total) by mouth at bedtime.   [DISCONTINUED] phentermine  (ADIPEX-P ) 37.5 MG tablet Take 1 tablet (37.5 mg total) by mouth daily before breakfast. (Patient not taking: Reported on 02/10/2024)   No facility-administered medications prior to visit.    Review of Systems  Constitutional: Negative.   HENT: Negative.    Eyes: Negative.   Respiratory: Negative.  Negative for shortness of breath.   Cardiovascular: Negative.  Negative for chest pain.  Gastrointestinal: Negative.  Negative for abdominal pain, constipation and diarrhea.  Genitourinary: Negative.   Musculoskeletal:  Negative for joint pain and myalgias.  Skin: Negative.   Neurological: Negative.  Negative for dizziness and headaches.  Endo/Heme/Allergies: Negative.   All other systems reviewed and are negative.      Objective:   BP 122/74   Pulse 70   Ht 5' 5 (1.651 m)   Wt 191 lb (86.6 kg)   SpO2 98%   BMI 31.78 kg/m   Vitals:   02/10/24 1424  BP: 122/74  Pulse: 70  Height: 5' 5 (1.651 m)  Weight: 191 lb (86.6 kg)  SpO2: 98%  BMI (Calculated): 31.78    Physical Exam Vitals and nursing note reviewed.  Constitutional:      Appearance: Normal appearance. She is normal weight.  HENT:     Head: Normocephalic and atraumatic.     Nose: Nose normal.     Mouth/Throat:     Mouth: Mucous membranes are moist.  Eyes:     Extraocular Movements: Extraocular movements intact.     Conjunctiva/sclera: Conjunctivae normal.     Pupils: Pupils are equal, round, and reactive to light.  Cardiovascular:     Rate and Rhythm:  Normal rate and regular rhythm.     Pulses: Normal pulses.     Heart sounds: Normal heart sounds.  Pulmonary:  Effort: Pulmonary effort is normal.     Breath sounds: Normal breath sounds.  Abdominal:     General: Abdomen is flat. Bowel sounds are normal.     Palpations: Abdomen is soft.  Musculoskeletal:        General: Normal range of motion.     Cervical back: Normal range of motion.  Skin:    General: Skin is warm and dry.  Neurological:     General: No focal deficit present.     Mental Status: She is alert and oriented to person, place, and time.  Psychiatric:        Mood and Affect: Mood normal.        Behavior: Behavior normal.        Thought Content: Thought content normal.        Judgment: Judgment normal.      Results for orders placed or performed in visit on 02/10/24  POCT Urinalysis Dipstick (81002)  Result Value Ref Range   Color, UA     Clarity, UA     Glucose, UA Negative Negative   Bilirubin, UA Negative    Ketones, UA Negative    Spec Grav, UA >=1.030 (A) 1.010 - 1.025   Blood, UA Negative    pH, UA 5.5 5.0 - 8.0   Protein, UA Negative Negative   Urobilinogen, UA 0.2 0.2 or 1.0 E.U./dL   Nitrite, UA Negative    Leukocytes, UA Negative Negative   Appearance     Odor      Recent Results (from the past 2160 hours)  POCT Urinalysis Dipstick (18997)     Status: Abnormal   Collection Time: 02/10/24  2:30 PM  Result Value Ref Range   Color, UA     Clarity, UA     Glucose, UA Negative Negative   Bilirubin, UA Negative    Ketones, UA Negative    Spec Grav, UA >=1.030 (A) 1.010 - 1.025   Blood, UA Negative    pH, UA 5.5 5.0 - 8.0   Protein, UA Negative Negative   Urobilinogen, UA 0.2 0.2 or 1.0 E.U./dL   Nitrite, UA Negative    Leukocytes, UA Negative Negative   Appearance     Odor        Assessment & Plan:  Urine culture Macrobid  Flomax  Toradol  Referral sent to urology  Problem List Items Addressed This Visit       Genitourinary    Kidney stone   Relevant Orders   Ambulatory referral to Urology     Other   Bilateral flank pain - Primary   Relevant Orders   POCT Urinalysis Dipstick (18997) (Completed)   Urine Culture   Ambulatory referral to Urology    Return if symptoms worsen or fail to improve.   Total time spent: 25 minutes  Google, NP  02/10/2024   This document may have been prepared by Dragon Voice Recognition software and as such may include unintentional dictation errors.

## 2024-02-13 ENCOUNTER — Other Ambulatory Visit: Payer: Self-pay | Admitting: Cardiology

## 2024-02-13 ENCOUNTER — Encounter: Payer: Self-pay | Admitting: Cardiology

## 2024-02-13 DIAGNOSIS — Z1211 Encounter for screening for malignant neoplasm of colon: Secondary | ICD-10-CM

## 2024-02-19 ENCOUNTER — Encounter: Payer: Self-pay | Admitting: Cardiology

## 2024-02-19 ENCOUNTER — Ambulatory Visit: Admitting: Cardiology

## 2024-02-19 ENCOUNTER — Other Ambulatory Visit: Payer: Self-pay | Admitting: Cardiology

## 2024-02-19 VITALS — BP 121/77 | HR 77 | Ht 65.0 in | Wt 189.3 lb

## 2024-02-19 DIAGNOSIS — Z0130 Encounter for examination of blood pressure without abnormal findings: Secondary | ICD-10-CM

## 2024-02-19 DIAGNOSIS — G8929 Other chronic pain: Secondary | ICD-10-CM | POA: Diagnosis not present

## 2024-02-19 DIAGNOSIS — M25572 Pain in left ankle and joints of left foot: Secondary | ICD-10-CM

## 2024-02-19 DIAGNOSIS — M542 Cervicalgia: Secondary | ICD-10-CM | POA: Diagnosis not present

## 2024-02-19 MED ORDER — BACLOFEN 10 MG PO TABS: 10.0000 mg | ORAL_TABLET | Freq: Every day | ORAL | 1 refills | Status: DC

## 2024-02-19 NOTE — Progress Notes (Signed)
 Established Patient Office Visit  Subjective:  Patient ID: Dawn Summers, female    DOB: 02-09-1966  Age: 58 y.o. MRN: [id number]  Chief Complaint  Patient presents with   Acute Visit    Shoulder and back pain started a week ago.    Patient in office for an acute visit, complaining of bilateral shoulder and back pain that started about one week ago. Patient is a Producer, television/film/video and works long days. Pain in her neck and shoulders, down the center of her back started about 3-4 weeks ago, getting worse. 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 Arms feel heavy when holding tools. Patient reports having neck surgery in the 1990's. Will get a CT of c-spine. Muscle relaxer sent in, meloxicam  refilled, recommend Voltaren gel.  Patient also complaining of left ankle pain, edema. Has used compression hose and heel inserts with no change in pain or edema. Will refer to podiatry for further evaluation.   Shoulder Pain  The pain is present in the neck, back, left shoulder and right shoulder. This is a new problem. The current episode started 1 to 4 weeks ago. There has been no history of extremity trauma. The problem occurs constantly. The problem has been waxing and waning. The quality of the pain is described as sharp. The pain is at a severity of 8/10. The pain is severe. Pertinent negatives include no limited range of motion, numbness or tingling. The symptoms are aggravated by activity. She has tried NSAIDS (Biofreeze) for the symptoms. The treatment provided mild relief.  Ankle Pain  The incident occurred more than 1 week ago. There was no injury mechanism. The pain is present in the left ankle. The quality of the pain is described as aching. The pain is at a severity of 5/10. The pain is mild. The pain has been Constant since onset. Pertinent negatives include no numbness or tingling. She reports no foreign bodies present. The symptoms are aggravated by movement, weight bearing and palpation. She has  tried acetaminophen  for the symptoms. The treatment provided no relief.    No other concerns at this time.   Past Medical History:  Diagnosis Date   Anxiety    Back pain    Cancer (HCC)    cervical   Carpal tunnel syndrome    Complication of anesthesia    slow to wake up   Family history of adverse reaction to anesthesia    slow to wake up and post op nausea   GERD (gastroesophageal reflux disease)    Headache    History of kidney stones    Hyperlipidemia    Infected hand    Insomnia    Nephrolithiasis    Sleep apnea    Urethral diverticulum    Vitamin D deficiency     Past Surgical History:  Procedure Laterality Date   Bladder Pack  2010   BREAST BIOPSY Left 2016   US  bx benign   CERVICAL SPINE SURGERY     C-3 and C-4 fusion   I & D EXTREMITY Right 06/22/2019   Procedure: IRRIGATION AND DEBRIDEMENT RIGHT HAND FOREIGN BODY;  Surgeon: Cleotilde Barrio, MD;  Location: [facility] ORS;  Service: Orthopedics;  Laterality: Right;   VAGINAL HYSTERECTOMY  1994   cervical cancer, ovaries intact    Social History   Socioeconomic History   Marital status: Single    Spouse name: Not on file   Number of children: 2   Years of education: Not on  file   Highest education level: Not on file  Occupational History    Employer: [employer]  Tobacco Use   Smoking status: Former    Current packs/day: 0.00    Average packs/day: 0.3 packs/day for 13.0 years (3.2 ttl pk-yrs)    Types: Cigarettes    Start date: 06/28/2006    Quit date: 06/22/2019    Years since quitting: 4.6   Smokeless tobacco: Never  Vaping Use   Vaping status: Never Used  Substance and Sexual Activity   Alcohol use: Not Currently    Alcohol/week: 0.0 standard drinks of alcohol   Drug use: No   Sexual activity: Not Currently    Birth control/protection: Surgical  Other Topics Concern   Not on file  Social History Narrative   Not on file   Social Drivers of Health   Financial Resource Strain: Not on file   Food Insecurity: Not on file  Transportation Needs: Not on file  Physical Activity: Not on file  Stress: Not on file  Social Connections: Not on file  Intimate Partner Violence: Not on file    Family History  Problem Relation Age of Onset   Diabetes Mother    Lung disease Mother        chronic   Congestive Heart Failure Mother    Coronary artery disease Mother    Stroke Father    Cancer Father        prostate cancer   Hyperlipidemia Father    Hypertension Father    Colon polyps Father    COPD Father    Cancer Sister        lung cancer   Breast cancer Sister    Healthy Daughter    Healthy Daughter     Allergies  Allergen Reactions   Latex     Other reaction(s): Unknown   Penicillins Hives    Did it involve swelling of the face/tongue/throat, SOB, or low BP? No Did it involve sudden or severe rash/hives, skin peeling, or any reaction on the inside of your mouth or nose? Yes Did you need to seek medical attention at a hospital or doctor's office? Yes When did it last happen?     childhood   If all above answers are "NO", may proceed with cephalosporin use.    Trazodone And Nefazodone Nausea Only and Other (See Comments)    dizziness   Sulfa Antibiotics Nausea And Vomiting   Trazodone Nausea Only and Rash    dizziness    Outpatient Medications Prior to Visit  Medication Sig   hydrochlorothiazide  (HYDRODIURIL ) 12.5 MG tablet Take 1 tablet (12.5 mg total) by mouth every morning.   ketorolac  (TORADOL ) 10 MG tablet Take 1 tablet (10 mg total) by mouth every 6 (six) hours as needed.   meloxicam  (MOBIC ) 15 MG tablet Take 1 tablet (15 mg total) by mouth daily.   olmesartan  (BENICAR ) 20 MG tablet Take 1.5 tablets (30 mg total) by mouth daily.   pantoprazole  (PROTONIX ) 40 MG tablet Take 1 tablet (40 mg total) by mouth daily.   Plecanatide  (TRULANCE ) 3 MG TABS Take 1 tablet (3 mg total) by mouth daily.   rosuvastatin  (CRESTOR ) 20 MG tablet Take 1 tablet (20 mg total) by  mouth at bedtime.   tamsulosin  (FLOMAX ) 0.4 MG CAPS capsule Take 1 capsule (0.4 mg total) by mouth daily.   No facility-administered medications prior to visit.    Review of Systems  Constitutional: Negative.   HENT: Negative.  Eyes: Negative.   Respiratory: Negative.  Negative for shortness of breath.   Cardiovascular: Negative.  Negative for chest pain.  Gastrointestinal: Negative.  Negative for abdominal pain, constipation and diarrhea.  Genitourinary: Negative.   Musculoskeletal:  Positive for back pain and joint pain. Negative for myalgias.  Skin: Negative.   Neurological: Negative.  Negative for dizziness, tingling, numbness and headaches.  Endo/Heme/Allergies: Negative.   All other systems reviewed and are negative.      Objective:   BP 121/77   Pulse 77   Ht 5' 5 (1.651 m)   Wt 189 lb 4.8 oz (85.9 kg)   SpO2 97%   BMI 31.50 kg/m   Vitals:   02/19/24 1031  BP: 121/77  Pulse: 77  Height: 5' 5 (1.651 m)  Weight: 189 lb 4.8 oz (85.9 kg)  SpO2: 97%  BMI (Calculated): 31.5    Physical Exam Vitals and nursing note reviewed.  Constitutional:      Appearance: Normal appearance. She is normal weight.  HENT:     Head: Normocephalic and atraumatic.     Nose: Nose normal.     Mouth/Throat:     Mouth: Mucous membranes are moist.  Eyes:     Extraocular Movements: Extraocular movements intact.     Conjunctiva/sclera: Conjunctivae normal.     Pupils: Pupils are equal, round, and reactive to light.  Cardiovascular:     Rate and Rhythm: Normal rate and regular rhythm.     Pulses: Normal pulses.     Heart sounds: Normal heart sounds.  Pulmonary:     Effort: Pulmonary effort is normal.     Breath sounds: Normal breath sounds.  Abdominal:     General: Abdomen is flat. Bowel sounds are normal.     Palpations: Abdomen is soft.  Musculoskeletal:        General: Normal range of motion.     Cervical back: Normal range of motion.  Skin:    General: Skin is warm  and dry.  Neurological:     General: No focal deficit present.     Mental Status: She is alert and oriented to person, place, and time.  Psychiatric:        Mood and Affect: Mood normal.        Behavior: Behavior normal.        Thought Content: Thought content normal.        Judgment: Judgment normal.      No results found for any visits on 02/19/24.  Recent Results (from the past 2160 hours)  POCT Urinalysis Dipstick (18997)     Status: Abnormal   Collection Time: 02/10/24  2:30 PM  Result Value Ref Range   Color, UA     Clarity, UA     Glucose, UA Negative Negative   Bilirubin, UA Negative    Ketones, UA Negative    Spec Grav, UA >=1.030 (A) 1.010 - 1.025   Blood, UA Negative    pH, UA 5.5 5.0 - 8.0   Protein, UA Negative Negative   Urobilinogen, UA 0.2 0.2 or 1.0 E.U./dL   Nitrite, UA Negative    Leukocytes, UA Negative Negative   Appearance     Odor        Assessment & Plan:  CT of c-spine Meloxicam  Baclofen  Voltaren gel Referral sent to podiatry  Problem List Items Addressed This Visit       Other   Neck pain   Relevant Orders   CT CERVICAL SPINE WO  CONTRAST   Chronic pain of left ankle - Primary   Relevant Medications   baclofen  (LIORESAL ) 10 MG tablet   Other Relevant Orders   Ambulatory referral to Podiatry    Return in about 2 weeks (around 03/04/2024).   Total time spent: 25 minutes  [employer], NP  02/19/2024   This document may have been prepared by Dragon Voice Recognition software and as such may include unintentional dictation errors.

## 2024-02-26 ENCOUNTER — Ambulatory Visit (INDEPENDENT_AMBULATORY_CARE_PROVIDER_SITE_OTHER)

## 2024-02-26 ENCOUNTER — Ambulatory Visit (INDEPENDENT_AMBULATORY_CARE_PROVIDER_SITE_OTHER): Admitting: Podiatry

## 2024-02-26 ENCOUNTER — Other Ambulatory Visit: Payer: Self-pay | Admitting: Podiatry

## 2024-02-26 DIAGNOSIS — M7662 Achilles tendinitis, left leg: Secondary | ICD-10-CM

## 2024-02-26 DIAGNOSIS — M79672 Pain in left foot: Secondary | ICD-10-CM

## 2024-02-26 NOTE — Progress Notes (Signed)
 Subjective:  Patient ID: Dawn Summers, female    DOB: [birth year],  MRN: [id number]  No chief complaint on file.   58 y.o. female presents with the above complaint.  Patient presents with left Achilles tendinitis.  Patient states it hurts with ambulation worse with pressure it came out of nowhere wanted get it evaluated she does a lot of standing on her feet.  Has not seen MRIs prior to seeing me denies any other acute complaints pain scale 7 out of 10 dull aching nature   Review of Systems: Negative except as noted in the HPI. Denies N/V/F/Ch.  Past Medical History:  Diagnosis Date   Anxiety    Back pain    Cancer (HCC)    cervical   Carpal tunnel syndrome    Complication of anesthesia    slow to wake up   Family history of adverse reaction to anesthesia    slow to wake up and post op nausea   GERD (gastroesophageal reflux disease)    Headache    History of kidney stones    Hyperlipidemia    Infected hand    Insomnia    Nephrolithiasis    Sleep apnea    Urethral diverticulum    Vitamin D deficiency     Current Outpatient Medications:    baclofen  (LIORESAL ) 10 MG tablet, Take 1 tablet (10 mg total) by mouth daily., Disp: 30 tablet, Rfl: 1   hydrochlorothiazide  (HYDRODIURIL ) 12.5 MG tablet, Take 1 tablet (12.5 mg total) by mouth every morning., Disp: 90 tablet, Rfl: 3   ketorolac  (TORADOL ) 10 MG tablet, Take 1 tablet (10 mg total) by mouth every 6 (six) hours as needed., Disp: 20 tablet, Rfl: 0   meloxicam  (MOBIC ) 15 MG tablet, Take 1 tablet (15 mg total) by mouth daily., Disp: 30 tablet, Rfl: 3   olmesartan  (BENICAR ) 20 MG tablet, Take 1.5 tablets (30 mg total) by mouth daily., Disp: 135 tablet, Rfl: 3   pantoprazole  (PROTONIX ) 40 MG tablet, Take 1 tablet (40 mg total) by mouth daily., Disp: 90 tablet, Rfl: 3   Plecanatide  (TRULANCE ) 3 MG TABS, Take 1 tablet (3 mg total) by mouth daily., Disp: 30 tablet, Rfl: 4   rosuvastatin  (CRESTOR ) 20 MG tablet, Take 1 tablet (20 mg  total) by mouth at bedtime., Disp: 90 tablet, Rfl: 3   tamsulosin  (FLOMAX ) 0.4 MG CAPS capsule, Take 1 capsule (0.4 mg total) by mouth daily., Disp: 30 capsule, Rfl: 0  Social History   Tobacco Use  Smoking Status Former   Current packs/day: 0.00   Average packs/day: 0.3 packs/day for 13.0 years (3.2 ttl pk-yrs)   Types: Cigarettes   Start date: 06/28/2006   Quit date: 06/22/2019   Years since quitting: 4.6  Smokeless Tobacco Never    Allergies  Allergen Reactions   Latex     Other reaction(s): Unknown   Penicillins Hives    Did it involve swelling of the face/tongue/throat, SOB, or low BP? No Did it involve sudden or severe rash/hives, skin peeling, or any reaction on the inside of your mouth or nose? Yes Did you need to seek medical attention at a hospital or doctor's office? Yes When did it last happen?     childhood   If all above answers are "NO", may proceed with cephalosporin use.    Trazodone And Nefazodone Nausea Only and Other (See Comments)    dizziness   Sulfa Antibiotics Nausea And Vomiting   Trazodone Nausea Only and Rash  dizziness   Objective:  There were no vitals filed for this visit. There is no height or weight on file to calculate BMI. Constitutional Well developed. Well nourished.  Vascular Dorsalis pedis pulses palpable bilaterally. Posterior tibial pulses palpable bilaterally. Capillary refill normal to all digits.  No cyanosis or clubbing noted. Pedal hair growth normal.  Neurologic Normal speech. Oriented to person, place, and time. Epicritic sensation to light touch grossly present bilaterally.  Dermatologic Nails well groomed and normal in appearance. No open wounds. No skin lesions.  Orthopedic: Pain on palpation to the left Achilles tendon insertional pain positive Haglund's deformity noted positive Silfverskiold test noted with gastrocnemius equinus.  No pain on palpation posterior tibial tendon peroneal tendon ATFL ligament.    Radiographs: None Assessment:   1. Left foot pain   2. Achilles tendinitis, left leg    Plan:  Patient was evaluated and treated and all questions answered.  Left Achilles tendinitis insertional pain - All questions and concerns were discussed with the patient extensive due to given the amount of pain that she is having she would benefit from cam boot immobilization and allow the soft tissue structure to heal appropriately.  Patient is in agreement.  Cam boot was dispensed - If there is no improvement discussed third injection versus an MRI.  No follow-ups on file.

## 2024-03-04 ENCOUNTER — Ambulatory Visit: Admitting: Cardiology

## 2024-03-05 ENCOUNTER — Ambulatory Visit
Admission: RE | Admit: 2024-03-05 | Discharge: 2024-03-05 | Disposition: A | Discharge status: Home / Self Care | Source: Ambulatory Visit | Attending: Cardiology

## 2024-03-05 ENCOUNTER — Ambulatory Visit
Admission: RE | Admit: 2024-03-05 | Discharge: 2024-03-05 | Disposition: A | Discharge status: Home / Self Care | Attending: Cardiology | Admitting: Cardiology

## 2024-03-05 DIAGNOSIS — M542 Cervicalgia: Secondary | ICD-10-CM

## 2024-03-09 ENCOUNTER — Encounter: Payer: Self-pay | Admitting: Cardiology

## 2024-03-09 ENCOUNTER — Other Ambulatory Visit: Payer: Self-pay | Admitting: Cardiology

## 2024-03-15 ENCOUNTER — Ambulatory Visit: Payer: Self-pay | Admitting: Cardiology

## 2024-03-19 ENCOUNTER — Other Ambulatory Visit

## 2024-03-19 DIAGNOSIS — R7303 Prediabetes: Secondary | ICD-10-CM

## 2024-03-19 DIAGNOSIS — I10 Essential (primary) hypertension: Secondary | ICD-10-CM

## 2024-03-19 DIAGNOSIS — Z1329 Encounter for screening for other suspected endocrine disorder: Secondary | ICD-10-CM

## 2024-03-19 DIAGNOSIS — E782 Mixed hyperlipidemia: Secondary | ICD-10-CM

## 2024-03-20 LAB — CMP14+EGFR
ALT: 13 IU/L (ref 0–32)
AST: 16 IU/L (ref 0–40)
Albumin: 4.4 g/dL (ref 3.8–4.9)
Alkaline Phosphatase: 98 IU/L (ref 49–135)
BUN/Creatinine Ratio: 27 — ABNORMAL HIGH (ref 9–23)
BUN: 28 mg/dL — ABNORMAL HIGH (ref 6–24)
Bilirubin Total: 0.3 mg/dL (ref 0.0–1.2)
CO2: 24 mmol/L (ref 20–29)
Calcium: 9.5 mg/dL (ref 8.7–10.2)
Chloride: 100 mmol/L (ref 96–106)
Creatinine, Ser: 1.05 mg/dL — ABNORMAL HIGH (ref 0.57–1.00)
Globulin, Total: 2.1 g/dL (ref 1.5–4.5)
Glucose: 104 mg/dL — ABNORMAL HIGH (ref 70–99)
Potassium: 4.6 mmol/L (ref 3.5–5.2)
Sodium: 139 mmol/L (ref 134–144)
Total Protein: 6.5 g/dL (ref 6.0–8.5)
eGFR: 61 mL/min/1.73 (ref >59)

## 2024-03-20 LAB — LIPID PANEL
Chol/HDL Ratio: 1.9 ratio (ref 0.0–4.4)
Cholesterol, Total: 149 mg/dL (ref 100–199)
HDL: 77 mg/dL (ref >39)
LDL Chol Calc (NIH): 62 mg/dL (ref 0–99)
Triglycerides: 45 mg/dL (ref 0–149)
VLDL Cholesterol Cal: 10 mg/dL (ref 5–40)

## 2024-03-20 LAB — HEMOGLOBIN A1C
Est. average glucose Bld gHb Est-mCnc: 131 mg/dL
Hgb A1c MFr Bld: 6.2 % — ABNORMAL HIGH (ref 4.8–5.6)

## 2024-03-20 LAB — TSH: TSH: 1.52 u[IU]/mL (ref 0.450–4.500)

## 2024-03-22 ENCOUNTER — Ambulatory Visit: Payer: Self-pay | Admitting: Cardiology

## 2024-03-23 ENCOUNTER — Ambulatory Visit: Admitting: Urology

## 2024-03-25 ENCOUNTER — Ambulatory Visit: Admitting: Podiatry

## 2024-03-25 ENCOUNTER — Encounter: Payer: Self-pay | Admitting: Podiatry

## 2024-03-25 DIAGNOSIS — Q666 Other congenital valgus deformities of feet: Secondary | ICD-10-CM | POA: Diagnosis not present

## 2024-03-25 DIAGNOSIS — M7662 Achilles tendinitis, left leg: Secondary | ICD-10-CM | POA: Diagnosis not present

## 2024-03-25 NOTE — Progress Notes (Signed)
 Subjective:  Patient ID: Dawn Summers, female    DOB: 03/23/1966,  MRN: [id number]  Chief Complaint  Patient presents with   Foot Pain    Pt stated that she is still having pain     58 y.o. female presents with the above complaint.  Patient presents for follow-up of her left Achilles tendinitis she states she is doing a little bit better.  She still has some residual pain.  Denies any other acute complaints.  Would like to discuss next treatment plan.   Review of Systems: Negative except as noted in the HPI. Denies N/V/F/Ch.  Past Medical History:  Diagnosis Date   Anxiety    Back pain    Cancer (HCC)    cervical   Carpal tunnel syndrome    Complication of anesthesia    slow to wake up   Family history of adverse reaction to anesthesia    slow to wake up and post op nausea   GERD (gastroesophageal reflux disease)    Headache    History of kidney stones    Hyperlipidemia    Infected hand    Insomnia    Nephrolithiasis    Sleep apnea    Urethral diverticulum    Vitamin D deficiency     Current Outpatient Medications:    baclofen  (LIORESAL ) 10 MG tablet, Take 1 tablet (10 mg total) by mouth daily., Disp: 30 tablet, Rfl: 1   hydrochlorothiazide  (HYDRODIURIL ) 12.5 MG tablet, Take 1 tablet (12.5 mg total) by mouth every morning., Disp: 90 tablet, Rfl: 3   ketorolac  (TORADOL ) 10 MG tablet, Take 1 tablet (10 mg total) by mouth every 6 (six) hours as needed., Disp: 20 tablet, Rfl: 0   meloxicam  (MOBIC ) 15 MG tablet, Take 1 tablet (15 mg total) by mouth daily., Disp: 30 tablet, Rfl: 3   olmesartan  (BENICAR ) 20 MG tablet, Take 1.5 tablets (30 mg total) by mouth daily., Disp: 135 tablet, Rfl: 3   pantoprazole  (PROTONIX ) 40 MG tablet, Take 1 tablet (40 mg total) by mouth daily., Disp: 90 tablet, Rfl: 3   Plecanatide  (TRULANCE ) 3 MG TABS, Take 1 tablet (3 mg total) by mouth daily., Disp: 30 tablet, Rfl: 4   rosuvastatin  (CRESTOR ) 20 MG tablet, Take 1 tablet (20 mg total) by mouth at  bedtime., Disp: 90 tablet, Rfl: 3   tamsulosin  (FLOMAX ) 0.4 MG CAPS capsule, TAKE 1 CAPSULE(0.4 MG) BY MOUTH DAILY, Disp: 30 capsule, Rfl: 0  Social History   Tobacco Use  Smoking Status Former   Current packs/day: 0.00   Average packs/day: 0.3 packs/day for 13.0 years (3.2 ttl pk-yrs)   Types: Cigarettes   Start date: 06/28/2006   Quit date: 06/22/2019   Years since quitting: 4.7  Smokeless Tobacco Never    Allergies  Allergen Reactions   Latex     Other reaction(s): Unknown   Penicillins Hives    Did it involve swelling of the face/tongue/throat, SOB, or low BP? No Did it involve sudden or severe rash/hives, skin peeling, or any reaction on the inside of your mouth or nose? Yes Did you need to seek medical attention at a hospital or doctor's office? Yes When did it last happen?     childhood   If all above answers are "NO", may proceed with cephalosporin use.    Trazodone And Nefazodone Nausea Only and Other (See Comments)    dizziness   Sulfa Antibiotics Nausea And Vomiting   Trazodone Nausea Only and Rash    dizziness  Objective:  There were no vitals filed for this visit. There is no height or weight on file to calculate BMI. Constitutional Well developed. Well nourished.  Vascular Dorsalis pedis pulses palpable bilaterally. Posterior tibial pulses palpable bilaterally. Capillary refill normal to all digits.  No cyanosis or clubbing noted. Pedal hair growth normal.  Neurologic Normal speech. Oriented to person, place, and time. Epicritic sensation to light touch grossly present bilaterally.  Dermatologic Nails well groomed and normal in appearance. No open wounds. No skin lesions.  Orthopedic: Pain on palpation to the left Achilles tendon insertional pain positive Haglund's deformity noted positive Silfverskiold test noted with gastrocnemius equinus.  No pain on palpation posterior tibial tendon peroneal tendon ATFL ligament.   Radiographs: None Assessment:    1. Achilles tendinitis, left leg   2. Pes planovalgus     Plan:  Patient was evaluated and treated and all questions answered.  Left Achilles tendinitis insertional pain - All questions and concerns were discussed with the patient extensive cam boot immobilization helped slightly.  She still has some residual pain for which she would benefit from steroid injection I discussed the risk of rupture associated with this she states understand like to proceed despite the risk -A steroid injection was performed at left Kager's fat pad using 1% plain Lidocaine  and 10 mg of Kenalog. This was well tolerated. - Care modification discussed  Pes planovalgus/foot deformity -I explained to patient the etiology of pes planovalgus and relationship with heel pain/arch pain and various treatment options were discussed.  Given patient foot structure in the setting of heel pain/arch pain I believe patient will benefit from custom-made orthotics to help control the hindfoot motion support the arch of the foot and take the stress away from arches.  Patient agrees with the plan like to proceed with orthotics -Patient was casted for orthotics with 1/4 inch heel lift    No follow-ups on file.

## 2024-03-26 ENCOUNTER — Encounter: Payer: Self-pay | Admitting: Cardiology

## 2024-03-26 ENCOUNTER — Ambulatory Visit (INDEPENDENT_AMBULATORY_CARE_PROVIDER_SITE_OTHER): Admitting: Cardiology

## 2024-03-26 VITALS — BP 114/72 | HR 69 | Ht 65.0 in | Wt 191.0 lb

## 2024-03-26 DIAGNOSIS — E66811 Obesity, class 1: Secondary | ICD-10-CM | POA: Diagnosis not present

## 2024-03-26 DIAGNOSIS — E782 Mixed hyperlipidemia: Secondary | ICD-10-CM

## 2024-03-26 DIAGNOSIS — Z23 Encounter for immunization: Secondary | ICD-10-CM

## 2024-03-26 DIAGNOSIS — R7303 Prediabetes: Secondary | ICD-10-CM

## 2024-03-26 DIAGNOSIS — I10 Essential (primary) hypertension: Secondary | ICD-10-CM

## 2024-03-26 DIAGNOSIS — Z1211 Encounter for screening for malignant neoplasm of colon: Secondary | ICD-10-CM

## 2024-03-26 DIAGNOSIS — R5383 Other fatigue: Secondary | ICD-10-CM | POA: Insufficient documentation

## 2024-03-26 NOTE — Progress Notes (Signed)
 Established Patient Office Visit  Subjective:  Patient ID: Dawn Summers, female    DOB: [birth year]  Age: 58 y.o. MRN: [id number]  Chief Complaint  Patient presents with   Follow-up    4 month lab results    Patient in office for 4 month follow up and discuss recent lab results. Patient complaining of fatigue. Will order a CBC, iron studies, vitamin D level and vitamin B12 level. Discussed recent fasting lab work. Hgb A1c abnormal, pre diabetic. LDL at goal. Decreased kidney function, drink more water.  Pap smear 06/2021 negative.  Due for colonoscopy in December. Will send referral to GI.  Patient requesting a flu vaccine today.  Continue same medications.     No other concerns at this time.   Past Medical History:  Diagnosis Date   Anxiety    Back pain    Cancer (HCC)    cervical   Carpal tunnel syndrome    Complication of anesthesia    slow to wake up   Family history of adverse reaction to anesthesia    slow to wake up and post op nausea   GERD (gastroesophageal reflux disease)    Headache    History of kidney stones    Hyperlipidemia    Infected hand    Insomnia    Nephrolithiasis    Sleep apnea    Urethral diverticulum    Vitamin D deficiency     Past Surgical History:  Procedure Laterality Date   Bladder Pack  2010   BREAST BIOPSY Left 2016   US  bx benign   CERVICAL SPINE SURGERY     C-3 and C-4 fusion   I & D EXTREMITY Right 06/22/2019   Procedure: IRRIGATION AND DEBRIDEMENT RIGHT HAND FOREIGN BODY;  Surgeon: Cleotilde Barrio, MD;  Location: [facility];  Service: Orthopedics;  Laterality: Right;   VAGINAL HYSTERECTOMY  1994   cervical cancer, ovaries intact    Social History   Socioeconomic History   Marital status: Single    Spouse name: Not on file   Number of children: 2   Years of education: Not on file   Highest education level: Not on file  Occupational History    Employer: [employer]  Tobacco Use   Smoking status: Former    Current  packs/day: 0.00    Average packs/day: 0.3 packs/day for 13.0 years (3.2 ttl pk-yrs)    Types: Cigarettes    Start date: 06/28/2006    Quit date: 06/22/2019    Years since quitting: 4.7   Smokeless tobacco: Never  Vaping Use   Vaping status: Never Used  Substance and Sexual Activity   Alcohol use: Not Currently    Alcohol/week: 0.0 standard drinks of alcohol   Drug use: No   Sexual activity: Not Currently    Birth control/protection: Surgical  Other Topics Concern   Not on file  Social History Narrative   Not on file   Social Drivers of Health   Financial Resource Strain: Not on file  Food Insecurity: Not on file  Transportation Needs: Not on file  Physical Activity: Not on file  Stress: Not on file  Social Connections: Not on file  Intimate Partner Violence: Not on file    Family History  Problem Relation Age of Onset   Diabetes Mother    Lung disease Mother        chronic   Congestive Heart Failure Mother    Coronary artery disease Mother    Stroke  Father    Cancer Father        prostate cancer   Hyperlipidemia Father    Hypertension Father    Colon polyps Father    COPD Father    Cancer Sister        lung cancer   Breast cancer Sister    Healthy Daughter    Healthy Daughter     Allergies  Allergen Reactions   Latex     Other reaction(s): Unknown   Penicillins Hives    Did it involve swelling of the face/tongue/throat, SOB, or low BP? No Did it involve sudden or severe rash/hives, skin peeling, or any reaction on the inside of your mouth or nose? Yes Did you need to seek medical attention at a hospital or doctor's office? Yes When did it last happen?     childhood   If all above answers are "NO", may proceed with cephalosporin use.    Trazodone And Nefazodone Nausea Only and Other (See Comments)    dizziness   Sulfa Antibiotics Nausea And Vomiting   Trazodone Nausea Only and Rash    dizziness    Outpatient Medications Prior to Visit  Medication  Sig   hydrochlorothiazide  (HYDRODIURIL ) 12.5 MG tablet Take 1 tablet (12.5 mg total) by mouth every morning.   meloxicam  (MOBIC ) 15 MG tablet Take 1 tablet (15 mg total) by mouth daily.   olmesartan  (BENICAR ) 20 MG tablet Take 1.5 tablets (30 mg total) by mouth daily.   pantoprazole  (PROTONIX ) 40 MG tablet Take 1 tablet (40 mg total) by mouth daily.   Plecanatide  (TRULANCE ) 3 MG TABS Take 1 tablet (3 mg total) by mouth daily.   rosuvastatin  (CRESTOR ) 20 MG tablet Take 1 tablet (20 mg total) by mouth at bedtime.   [DISCONTINUED] baclofen  (LIORESAL ) 10 MG tablet Take 1 tablet (10 mg total) by mouth daily. (Patient not taking: Reported on 03/26/2024)   [DISCONTINUED] ketorolac  (TORADOL ) 10 MG tablet Take 1 tablet (10 mg total) by mouth every 6 (six) hours as needed. (Patient not taking: Reported on 03/26/2024)   [DISCONTINUED] tamsulosin  (FLOMAX ) 0.4 MG CAPS capsule TAKE 1 CAPSULE(0.4 MG) BY MOUTH DAILY (Patient not taking: Reported on 03/26/2024)   No facility-administered medications prior to visit.    Review of Systems  Constitutional: Negative.   HENT: Negative.    Eyes: Negative.   Respiratory: Negative.  Negative for shortness of breath.   Cardiovascular: Negative.  Negative for chest pain.  Gastrointestinal: Negative.  Negative for abdominal pain, constipation and diarrhea.  Genitourinary: Negative.   Musculoskeletal:  Negative for joint pain and myalgias.  Skin: Negative.   Neurological: Negative.  Negative for dizziness and headaches.  Endo/Heme/Allergies: Negative.   All other systems reviewed and are negative.      Objective:   BP 114/72   Pulse 69   Ht 5' 5 (1.651 m)   Wt 191 lb (86.6 kg)   SpO2 98%   BMI 31.78 kg/m   Vitals:   03/26/24 0854  BP: 114/72  Pulse: 69  Height: 5' 5 (1.651 m)  Weight: 191 lb (86.6 kg)  SpO2: 98%  BMI (Calculated): 31.78    Physical Exam Vitals and nursing note reviewed.  Constitutional:      Appearance: Normal appearance. She  is normal weight.  HENT:     Head: Normocephalic and atraumatic.     Nose: Nose normal.     Mouth/Throat:     Mouth: Mucous membranes are moist.  Eyes:  Extraocular Movements: Extraocular movements intact.     Conjunctiva/sclera: Conjunctivae normal.     Pupils: Pupils are equal, round, and reactive to light.  Cardiovascular:     Rate and Rhythm: Normal rate and regular rhythm.     Pulses: Normal pulses.     Heart sounds: Normal heart sounds.  Pulmonary:     Effort: Pulmonary effort is normal.     Breath sounds: Normal breath sounds.  Abdominal:     General: Abdomen is flat. Bowel sounds are normal.     Palpations: Abdomen is soft.  Musculoskeletal:        General: Normal range of motion.     Cervical back: Normal range of motion.  Skin:    General: Skin is warm and dry.  Neurological:     General: No focal deficit present.     Mental Status: She is alert and oriented to person, place, and time.  Psychiatric:        Mood and Affect: Mood normal.        Behavior: Behavior normal.        Thought Content: Thought content normal.        Judgment: Judgment normal.      No results found for any visits on 03/26/24.  Recent Results (from the past 2160 hours)  POCT Urinalysis Dipstick (18997)     Status: Abnormal   Collection Time: 02/10/24  2:30 PM  Result Value Ref Range   Color, UA     Clarity, UA     Glucose, UA Negative Negative   Bilirubin, UA Negative    Ketones, UA Negative    Spec Grav, UA >=1.030 (A) 1.010 - 1.025   Blood, UA Negative    pH, UA 5.5 5.0 - 8.0   Protein, UA Negative Negative   Urobilinogen, UA 0.2 0.2 or 1.0 E.U./dL   Nitrite, UA Negative    Leukocytes, UA Negative Negative   Appearance     Odor    Hemoglobin A1c     Status: Abnormal   Collection Time: 03/19/24  8:28 AM  Result Value Ref Range   Hgb A1c MFr Bld 6.2 (H) 4.8 - 5.6 %    Comment:          Prediabetes: 5.7 - 6.4          Diabetes: >6.4          Glycemic control for adults  with diabetes: <7.0    Est. average glucose Bld gHb Est-mCnc 131 mg/dL  TSH     Status: None   Collection Time: 03/19/24  8:28 AM  Result Value Ref Range   TSH 1.520 0.450 - 4.500 uIU/mL  Lipid panel     Status: None   Collection Time: 03/19/24  8:28 AM  Result Value Ref Range   Cholesterol, Total 149 100 - 199 mg/dL   Triglycerides 45 0 - 149 mg/dL   HDL 77 >60 mg/dL   VLDL Cholesterol Cal 10 5 - 40 mg/dL   LDL Chol Calc (NIH) 62 0 - 99 mg/dL   Chol/HDL Ratio 1.9 0.0 - 4.4 ratio    Comment:                                   T. Chol/HDL Ratio  Men  Women                               1/2 Avg.Risk  3.4    3.3                                   Avg.Risk  5.0    4.4                                2X Avg.Risk  9.6    7.1                                3X Avg.Risk 23.4   11.0   CMP14+EGFR     Status: Abnormal   Collection Time: 03/19/24  8:28 AM  Result Value Ref Range   Glucose 104 (H) 70 - 99 mg/dL   BUN 28 (H) 6 - 24 mg/dL   Creatinine, Ser 8.94 (H) 0.57 - 1.00 mg/dL   eGFR 61 >40 fO/fpw/8.26   BUN/Creatinine Ratio 27 (H) 9 - 23   Sodium 139 134 - 144 mmol/L   Potassium 4.6 3.5 - 5.2 mmol/L   Chloride 100 96 - 106 mmol/L   CO2 24 20 - 29 mmol/L   Calcium  9.5 8.7 - 10.2 mg/dL   Total Protein 6.5 6.0 - 8.5 g/dL   Albumin 4.4 3.8 - 4.9 g/dL   Globulin, Total 2.1 1.5 - 4.5 g/dL   Bilirubin Total 0.3 0.0 - 1.2 mg/dL   Alkaline Phosphatase 98 49 - 135 IU/L    Comment:               **Please note reference interval change**   AST 16 0 - 40 IU/L   ALT 13 0 - 32 IU/L      Assessment & Plan:  Blood work today Referral sent to GI for colonoscopy Flu vaccine today Increase water intake Work on diet and exercise to lower A1c Continue same medications  Problem List Items Addressed This Visit       Cardiovascular and Mediastinum   Primary hypertension - Primary     Other   Mixed hyperlipidemia   Pre-diabetes   Obesity (BMI  30.0-34.9)   Other fatigue   Relevant Orders   Vitamin D (25 hydroxy)   CBC with Differential/Platelet   Iron, TIBC and Ferritin Panel   Vitamin B12   Other Visit Diagnoses       Colon cancer screening       Relevant Orders   Ambulatory referral to Gastroenterology     Needs flu shot       Relevant Orders   Influenza, MDCK, trivalent, PF(Flucelvax egg-free) (Completed)       Return in about 4 months (around 07/27/2024) for fasting lab work prior.   Total time spent: 25 minutes  [employer], NP  03/26/2024   This document may have been prepared by Dragon Voice Recognition software and as such may include unintentional dictation errors.

## 2024-03-27 LAB — CBC WITH DIFFERENTIAL/PLATELET
Basophils Absolute: 0.1 x10E3/uL (ref 0.0–0.2)
Basos: 1 %
EOS (ABSOLUTE): 0.2 x10E3/uL (ref 0.0–0.4)
Eos: 2 %
Hematocrit: 44.7 % (ref 34.0–46.6)
Hemoglobin: 14.5 g/dL (ref 11.1–15.9)
Immature Grans (Abs): 0 x10E3/uL (ref 0.0–0.1)
Immature Granulocytes: 0 %
Lymphocytes Absolute: 2.6 x10E3/uL (ref 0.7–3.1)
Lymphs: 29 %
MCH: 29.1 pg (ref 26.6–33.0)
MCHC: 32.4 g/dL (ref 31.5–35.7)
MCV: 90 fL (ref 79–97)
Monocytes Absolute: 0.7 x10E3/uL (ref 0.1–0.9)
Monocytes: 7 %
Neutrophils Absolute: 5.3 x10E3/uL (ref 1.4–7.0)
Neutrophils: 61 %
Platelets: 322 x10E3/uL (ref 150–450)
RBC: 4.98 x10E6/uL (ref 3.77–5.28)
RDW: 13.3 % (ref 11.7–15.4)
WBC: 8.8 x10E3/uL (ref 3.4–10.8)

## 2024-03-27 LAB — IRON,TIBC AND FERRITIN PANEL
Ferritin: 17 ng/mL (ref 15–150)
Iron Saturation: 10 % — ABNORMAL LOW (ref 15–55)
Iron: 48 ug/dL (ref 27–159)
Total Iron Binding Capacity: 480 ug/dL — ABNORMAL HIGH (ref 250–450)
UIBC: 432 ug/dL — ABNORMAL HIGH (ref 131–425)

## 2024-03-27 LAB — VITAMIN D 25 HYDROXY (VIT D DEFICIENCY, FRACTURES): Vit D, 25-Hydroxy: 40 ng/mL (ref 30.0–100.0)

## 2024-03-27 LAB — VITAMIN B12: Vitamin B-12: 678 pg/mL (ref 232–1245)

## 2024-03-29 ENCOUNTER — Telehealth: Payer: Self-pay

## 2024-03-29 NOTE — Telephone Encounter (Signed)
 Pt requesting call back to schedule colonoscopy

## 2024-03-30 ENCOUNTER — Other Ambulatory Visit: Payer: Self-pay

## 2024-03-30 ENCOUNTER — Telehealth: Payer: Self-pay

## 2024-03-30 DIAGNOSIS — Z1211 Encounter for screening for malignant neoplasm of colon: Secondary | ICD-10-CM

## 2024-03-30 MED ORDER — PEG 3350-KCL-NA BICARB-NACL 420 G PO SOLR: 4000.0000 mL | Freq: Once | ORAL | 0 refills | Status: AC

## 2024-03-30 NOTE — Progress Notes (Signed)
 Screening colonoscopy. Pt does not have history of colon polyps.  Thanks,  Kings, CMA

## 2024-03-30 NOTE — Telephone Encounter (Signed)
 Gastroenterology Pre-Procedure Review  Request Date: 06/14/24 Requesting Physician: Dr. Melany  PATIENT REVIEW QUESTIONS: The patient responded to the following health history questions as indicated:    1. Are you having any GI issues? no 2. Do you have a personal history of Polyps? no 3. Do you have a family history of Colon Cancer or Polyps? no 4. Diabetes Mellitus? no 5. Joint replacements in the past 12 months?no 6. Major health problems in the past 3 months?no 7. Any artificial heart valves, MVP, or defibrillator?no    MEDICATIONS & ALLERGIES:    Patient reports the following regarding taking any anticoagulation/antiplatelet therapy:   Plavix, Coumadin, Eliquis, Xarelto, Lovenox, Pradaxa, Brilinta, or Effient? no Aspirin? no  Patient confirms/reports the following medications:  Current Outpatient Medications  Medication Sig Dispense Refill   hydrochlorothiazide  (HYDRODIURIL ) 12.5 MG tablet Take 1 tablet (12.5 mg total) by mouth every morning. 90 tablet 3   meloxicam  (MOBIC ) 15 MG tablet Take 1 tablet (15 mg total) by mouth daily. 30 tablet 3   olmesartan  (BENICAR ) 20 MG tablet Take 1.5 tablets (30 mg total) by mouth daily. 135 tablet 3   pantoprazole  (PROTONIX ) 40 MG tablet Take 1 tablet (40 mg total) by mouth daily. 90 tablet 3   Plecanatide  (TRULANCE ) 3 MG TABS Take 1 tablet (3 mg total) by mouth daily. 30 tablet 4   rosuvastatin  (CRESTOR ) 20 MG tablet Take 1 tablet (20 mg total) by mouth at bedtime. 90 tablet 3   No current facility-administered medications for this visit.    Patient confirms/reports the following allergies:  Allergies  Allergen Reactions   Latex     Other reaction(s): Unknown   Penicillins Hives    Did it involve swelling of the face/tongue/throat, SOB, or low BP? No Did it involve sudden or severe rash/hives, skin peeling, or any reaction on the inside of your mouth or nose? Yes Did you need to seek medical attention at a hospital or doctor's  office? Yes When did it last happen?     childhood   If all above answers are "NO", may proceed with cephalosporin use.    Trazodone And Nefazodone Nausea Only and Other (See Comments)    dizziness   Sulfa Antibiotics Nausea And Vomiting   Trazodone Nausea Only and Rash    dizziness    No orders of the defined types were placed in this encounter.   AUTHORIZATION INFORMATION Primary Insurance: 1D#: Group #:  Secondary Insurance: 1D#: Group #:  SCHEDULE INFORMATION: Date: 06/14/24 Time: Location: [facility]

## 2024-04-06 ENCOUNTER — Telehealth: Payer: Self-pay

## 2024-04-06 NOTE — Telephone Encounter (Signed)
 Pt states she could see the online message regarding her lab results but wants an explanation on what the numbers mean. However, pt's note states they were discussed at her last visit.

## 2024-04-20 ENCOUNTER — Ambulatory Visit: Admitting: Urology

## 2024-04-22 ENCOUNTER — Encounter: Payer: Self-pay | Admitting: Podiatry

## 2024-04-22 ENCOUNTER — Ambulatory Visit: Admitting: Podiatry

## 2024-04-22 DIAGNOSIS — M7662 Achilles tendinitis, left leg: Secondary | ICD-10-CM

## 2024-04-22 NOTE — Progress Notes (Signed)
 Subjective:  Patient ID: Dawn Summers, female    DOB: 04-29-1966,  MRN: [id number]  Chief Complaint  Patient presents with   Foot Pain    58 y.o. female presents with the above complaint.  Patient presents for follow-up of her left Achilles tendinitis she states she is doing a little bit better.  She still has some residual pain.  Denies any other acute complaints.  Would like to discuss next treatment plan.   Review of Systems: Negative except as noted in the HPI. Denies N/V/F/Ch.  Past Medical History:  Diagnosis Date   Anxiety    Back pain    Cancer (HCC)    cervical   Carpal tunnel syndrome    Complication of anesthesia    slow to wake up   Family history of adverse reaction to anesthesia    slow to wake up and post op nausea   GERD (gastroesophageal reflux disease)    Headache    History of kidney stones    Hyperlipidemia    Infected hand    Insomnia    Nephrolithiasis    Sleep apnea    Urethral diverticulum    Vitamin D deficiency     Current Outpatient Medications:    hydrochlorothiazide  (HYDRODIURIL ) 12.5 MG tablet, Take 1 tablet (12.5 mg total) by mouth every morning., Disp: 90 tablet, Rfl: 3   meloxicam  (MOBIC ) 15 MG tablet, Take 1 tablet (15 mg total) by mouth daily., Disp: 30 tablet, Rfl: 3   olmesartan  (BENICAR ) 20 MG tablet, Take 1.5 tablets (30 mg total) by mouth daily., Disp: 135 tablet, Rfl: 3   pantoprazole  (PROTONIX ) 40 MG tablet, Take 1 tablet (40 mg total) by mouth daily., Disp: 90 tablet, Rfl: 3   Plecanatide  (TRULANCE ) 3 MG TABS, Take 1 tablet (3 mg total) by mouth daily., Disp: 30 tablet, Rfl: 4   rosuvastatin  (CRESTOR ) 20 MG tablet, Take 1 tablet (20 mg total) by mouth at bedtime., Disp: 90 tablet, Rfl: 3  Social History   Tobacco Use  Smoking Status Former   Current packs/day: 0.00   Average packs/day: 0.3 packs/day for 13.0 years (3.2 ttl pk-yrs)   Types: Cigarettes   Start date: 06/28/2006   Quit date: 06/22/2019   Years since quitting:  4.8  Smokeless Tobacco Never    Allergies  Allergen Reactions   Latex     Other reaction(s): Unknown   Penicillins Hives    Did it involve swelling of the face/tongue/throat, SOB, or low BP? No Did it involve sudden or severe rash/hives, skin peeling, or any reaction on the inside of your mouth or nose? Yes Did you need to seek medical attention at a hospital or doctor's office? Yes When did it last happen?     childhood   If all above answers are "NO", may proceed with cephalosporin use.    Trazodone And Nefazodone Nausea Only and Other (See Comments)    dizziness   Sulfa Antibiotics Nausea And Vomiting   Trazodone Nausea Only and Rash    dizziness   Objective:  There were no vitals filed for this visit. There is no height or weight on file to calculate BMI. Constitutional Well developed. Well nourished.  Vascular Dorsalis pedis pulses palpable bilaterally. Posterior tibial pulses palpable bilaterally. Capillary refill normal to all digits.  No cyanosis or clubbing noted. Pedal hair growth normal.  Neurologic Normal speech. Oriented to person, place, and time. Epicritic sensation to light touch grossly present bilaterally.  Dermatologic Nails well groomed  and normal in appearance. No open wounds. No skin lesions.  Orthopedic: Pain on palpation to the left Achilles tendon insertional pain positive Haglund's deformity noted positive Silfverskiold test noted with gastrocnemius equinus.  No pain on palpation posterior tibial tendon peroneal tendon ATFL ligament.   Radiographs: None Assessment:   1. Achilles tendinitis, left leg     Plan:  Patient was evaluated and treated and all questions answered.  Left Achilles tendinitis insertional pain - All questions and concerns were discussed with the patient clinically her pain has not resolved she still continues to have pain to the left Achilles tendon she has failed cam boot immobilization and injection.  At this time she  would benefit from MRI evaluation rule out interstitial tearing versus tendinosis.  She agrees with the plan - MRI was ordered.  Pes planovalgus/foot deformity -I explained to patient the etiology of pes planovalgus and relationship with heel pain/arch pain and various treatment options were discussed.  Given patient foot structure in the setting of heel pain/arch pain I believe patient will benefit from custom-made orthotics to help control the hindfoot motion support the arch of the foot and take the stress away from arches.  Patient agrees with the plan like to proceed with orthotics -Patient was casted for orthotics with 1/4 inch heel lift    No follow-ups on file.

## 2024-05-02 ENCOUNTER — Other Ambulatory Visit: Payer: Self-pay | Admitting: Cardiology

## 2024-05-05 ENCOUNTER — Telehealth: Payer: Self-pay

## 2024-05-05 NOTE — Telephone Encounter (Signed)
 Called patient to schedule a [facility] appointment in [place]. Patient was not able to connect with me, so I leave a VM for the patient to give us  a call back so we could get this appointment scheduled.

## 2024-05-07 ENCOUNTER — Other Ambulatory Visit: Payer: Self-pay | Admitting: Cardiology

## 2024-05-11 ENCOUNTER — Inpatient Hospital Stay
Admission: RE | Admit: 2024-05-11 | Discharge: 2024-05-11 | Disposition: A | Source: Ambulatory Visit | Attending: Podiatry

## 2024-05-11 DIAGNOSIS — M7662 Achilles tendinitis, left leg: Secondary | ICD-10-CM

## 2024-05-18 ENCOUNTER — Ambulatory Visit: Admitting: Urology

## 2024-05-18 ENCOUNTER — Ambulatory Visit: Admitting: Podiatry

## 2024-05-18 DIAGNOSIS — M9262 Juvenile osteochondrosis of tarsus, left ankle: Secondary | ICD-10-CM

## 2024-05-18 DIAGNOSIS — Z01818 Encounter for other preprocedural examination: Secondary | ICD-10-CM | POA: Diagnosis not present

## 2024-05-18 DIAGNOSIS — M7662 Achilles tendinitis, left leg: Secondary | ICD-10-CM

## 2024-05-18 DIAGNOSIS — M62462 Contracture of muscle, left lower leg: Secondary | ICD-10-CM

## 2024-05-18 NOTE — Progress Notes (Unsigned)
 Subjective:  Patient ID: Dawn Summers, female    DOB: August 01, 1965,  MRN: [id number]  Chief Complaint  Patient presents with   Foot Pain    Left foot achilles tendinitis pain follow up     58 y.o. female presents with the above complaint.  Patient presents for follow-up of her left Achilles tendinitis she states it continued be bothersome she states that her she would like to discuss surgical options she has failed all conservative care at this time include shoe gear modification padding offloading injection.  She states understanding  Review of Systems: Negative except as noted in the HPI. Denies N/V/F/Ch.  Past Medical History:  Diagnosis Date   Anxiety    Back pain    Cancer (HCC)    cervical   Carpal tunnel syndrome    Complication of anesthesia    slow to wake up   Family history of adverse reaction to anesthesia    slow to wake up and post op nausea   GERD (gastroesophageal reflux disease)    Headache    History of kidney stones    Hyperlipidemia    Infected hand    Insomnia    Nephrolithiasis    Sleep apnea    Urethral diverticulum    Vitamin D  deficiency     Current Outpatient Medications:    hydrochlorothiazide  (HYDRODIURIL ) 12.5 MG tablet, Take 1 tablet (12.5 mg total) by mouth every morning., Disp: 90 tablet, Rfl: 3   meloxicam  (MOBIC ) 15 MG tablet, Take 1 tablet (15 mg total) by mouth daily., Disp: 30 tablet, Rfl: 3   olmesartan  (BENICAR ) 20 MG tablet, TAKE 1 AND 1/2 TABLETS BY MOUTH DAILY., Disp: 135 tablet, Rfl: 3   pantoprazole  (PROTONIX ) 40 MG tablet, Take 1 tablet (40 mg total) by mouth daily., Disp: 90 tablet, Rfl: 3   Plecanatide  (TRULANCE ) 3 MG TABS, Take 1 tablet (3 mg total) by mouth daily., Disp: 30 tablet, Rfl: 4   rosuvastatin  (CRESTOR ) 20 MG tablet, TAKE 1 TABLET(20 MG) BY MOUTH AT BEDTIME, Disp: 90 tablet, Rfl: 3  Social History   Tobacco Use  Smoking Status Former   Current packs/day: 0.00   Average packs/day: 0.3 packs/day for 13.0 years  (3.2 ttl pk-yrs)   Types: Cigarettes   Start date: 06/28/2006   Quit date: 06/22/2019   Years since quitting: 4.9  Smokeless Tobacco Never    Allergies  Allergen Reactions   Latex     Other reaction(s): Unknown   Penicillins Hives    Did it involve swelling of the face/tongue/throat, SOB, or low BP? No Did it involve sudden or severe rash/hives, skin peeling, or any reaction on the inside of your mouth or nose? Yes Did you need to seek medical attention at a hospital or doctor's office? Yes When did it last happen?     childhood   If all above answers are "NO", may proceed with cephalosporin use.    Trazodone And Nefazodone Nausea Only and Other (See Comments)    dizziness   Sulfa Antibiotics Nausea And Vomiting   Trazodone Nausea Only and Rash    dizziness   Objective:  There were no vitals filed for this visit. There is no height or weight on file to calculate BMI. Constitutional Well developed. Well nourished.  Vascular Dorsalis pedis pulses palpable bilaterally. Posterior tibial pulses palpable bilaterally. Capillary refill normal to all digits.  No cyanosis or clubbing noted. Pedal hair growth normal.  Neurologic Normal speech. Oriented to person, place, and time.  Epicritic sensation to light touch grossly present bilaterally.  Dermatologic Nails well groomed and normal in appearance. No open wounds. No skin lesions.  Orthopedic: Pain on palpation to the left Achilles tendon insertional pain positive Haglund's deformity noted positive Silfverskiold test noted with gastrocnemius equinus.  No pain on palpation posterior tibial tendon peroneal tendon ATFL ligament.   Radiographs: IMPRESSION: 1. Chronic appearing moderate tendinopathy involving the Achilles tendon. The tendon is thickened and demonstrates an interstitial tear. No full-thickness tear. 2. Intact medial and lateral ankle ligaments and tendons. 3. Small focus of subchondral cystic change involving the  medial talar dome. No osteochondral lesion. Assessment:   1. Achilles tendinitis, left leg   2. Haglund's deformity, left   3. Gastrocnemius equinus, left      Plan:  Patient was evaluated and treated and all questions answered.  Left Achilles tendinitis insertional pain with underlying gastrocnemius equinus and Haglund's deformity - Patient continues to have pain to the left Achilles tendon MRI was reviewed which shows good amount of tendinosis as well as interstitial tearing I discussed this with the patient at this time patient will benefit from surgical intervention I discussed my preoperative postop plan with the patient in extensive detail she would benefit from left Achilles tendon repair with resection of Haglund's deformity as well as gastrocnemius recession I discussed with patient she states understand like to proceed with surgery -Informed surgical risk consent was reviewed and read aloud to the patient.  I reviewed the films.  I have discussed my findings with the patient in great detail.  I have discussed all risks including but not limited to infection, stiffness, scarring, limp, disability, deformity, damage to blood vessels and nerves, numbness, poor healing, need for braces, arthritis, chronic pain, amputation, death.  All benefits and realistic expectations discussed in great detail.  I have made no promises as to the outcome.  I have provided realistic expectations.  I have offered the patient a 2nd opinion, which they have declined and assured me they preferred to proceed despite the risks   Pes planovalgus/foot deformity -I explained to patient the etiology of pes planovalgus and relationship with heel pain/arch pain and various treatment options were discussed.  Given patient foot structure in the setting of heel pain/arch pain I believe patient will benefit from custom-made orthotics to help control the hindfoot motion support the arch of the foot and take the stress away  from arches.  Patient agrees with the plan like to proceed with orthotics -Patient was casted for orthotics with 1/4 inch heel lift    No follow-ups on file.

## 2024-05-27 ENCOUNTER — Other Ambulatory Visit: Payer: Self-pay | Admitting: Cardiology

## 2024-06-04 ENCOUNTER — Ambulatory Visit: Admitting: Podiatrist

## 2024-06-04 DIAGNOSIS — M7662 Achilles tendinitis, left leg: Secondary | ICD-10-CM

## 2024-06-04 NOTE — Progress Notes (Signed)
 Dawn Summers presents today to pick up he orthotics.  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 orthotics that were ordered and are dispensed.  I recommended for her to take the orthotics home and try them in other shoes.   She has a dress code at work and has to wear a dress type of shoe.  I expect she will have trouble making these orthotics fit in her work shoes Dawn Summers will call if she would like me to re order her a different pair in dress- If she decides to do this, we will exchange the orthotics with no charge for a new pair.

## 2024-06-10 ENCOUNTER — Encounter: Payer: Self-pay | Admitting: Gastroenterology

## 2024-06-10 NOTE — Anesthesia Preprocedure Evaluation (Addendum)
 Anesthesia Evaluation    Airway        Dental   Pulmonary Patient abstained from smoking., former smoker          Cardiovascular hypertension,      Neuro/Psych    GI/Hepatic   Endo/Other    Renal/GU      Musculoskeletal   Abdominal   Peds  Hematology   Anesthesia Other Findings GERD (gastroesophageal reflux disease) Hyperlipidemia Vitamin D  deficiency Insomnia Sleep apnea Carpal tunnel syndrome Cancer (HCC) Nephrolithiasis Urethral diverticulum Headache Back pain Infected hand Anxiety History of kidney stones Complication of anesthesia  Family history of adverse reaction to anesthesia--slow to awaken from anesthesia Hypertension Arthritis Obesity    Reproductive/Obstetrics                              Anesthesia Physical Anesthesia Plan  ASA: 2  Anesthesia Plan: General   Post-op Pain Management:    Induction: Intravenous  PONV Risk Score and Plan:   Airway Management Planned: Natural Airway and Nasal Cannula  Additional Equipment:   Intra-op Plan:   Post-operative Plan:   Informed Consent: I have reviewed the patients History and Physical, chart, labs and discussed the procedure including the risks, benefits and alternatives for the proposed anesthesia with the patient or authorized representative who has indicated his/her understanding and acceptance.     Dental Advisory Given  Plan Discussed with: Anesthesiologist, CRNA and Surgeon  Anesthesia Plan Comments: (Patient consented for risks of anesthesia including but not limited to:  - adverse reactions to medications - risk of airway placement if required - damage to eyes, teeth, lips or other oral mucosa - nerve damage due to positioning  - sore throat or hoarseness - Damage to heart, brain, nerves, lungs, other parts of body or loss of life  Patient voiced understanding and assent.)        Anesthesia  Quick Evaluation

## 2024-06-14 ENCOUNTER — Ambulatory Visit: Payer: Self-pay | Admitting: Anesthesiology

## 2024-06-14 ENCOUNTER — Ambulatory Visit
Admission: RE | Admit: 2024-06-14 | Discharge: 2024-06-14 | Disposition: A | Discharge status: Home / Self Care | Attending: Gastroenterology | Admitting: Gastroenterology

## 2024-06-14 ENCOUNTER — Other Ambulatory Visit: Payer: Self-pay

## 2024-06-14 ENCOUNTER — Encounter: Payer: Self-pay | Admitting: Anesthesiology

## 2024-06-14 ENCOUNTER — Encounter: Payer: Self-pay | Admitting: Gastroenterology

## 2024-06-14 DIAGNOSIS — G4730 Sleep apnea, unspecified: Secondary | ICD-10-CM | POA: Insufficient documentation

## 2024-06-14 DIAGNOSIS — Z1211 Encounter for screening for malignant neoplasm of colon: Secondary | ICD-10-CM | POA: Diagnosis not present

## 2024-06-14 DIAGNOSIS — I10 Essential (primary) hypertension: Secondary | ICD-10-CM | POA: Diagnosis not present

## 2024-06-14 DIAGNOSIS — M1990 Unspecified osteoarthritis, unspecified site: Secondary | ICD-10-CM | POA: Diagnosis not present

## 2024-06-14 DIAGNOSIS — K640 First degree hemorrhoids: Secondary | ICD-10-CM | POA: Diagnosis not present

## 2024-06-14 DIAGNOSIS — Z87891 Personal history of nicotine dependence: Secondary | ICD-10-CM | POA: Insufficient documentation

## 2024-06-14 DIAGNOSIS — D122 Benign neoplasm of ascending colon: Secondary | ICD-10-CM

## 2024-06-14 DIAGNOSIS — K219 Gastro-esophageal reflux disease without esophagitis: Secondary | ICD-10-CM | POA: Insufficient documentation

## 2024-06-14 DIAGNOSIS — K5730 Diverticulosis of large intestine without perforation or abscess without bleeding: Secondary | ICD-10-CM | POA: Insufficient documentation

## 2024-06-14 HISTORY — DX: Obesity, unspecified: E66.9

## 2024-06-14 HISTORY — PX: COLONOSCOPY: SHX5424

## 2024-06-14 HISTORY — DX: Unspecified osteoarthritis, unspecified site: M19.90

## 2024-06-14 HISTORY — PX: POLYPECTOMY: SHX149

## 2024-06-14 SURGERY — COLONOSCOPY
Anesthesia: General | Site: Rectum

## 2024-06-14 MED ORDER — PROPOFOL 10 MG/ML IV BOLUS
INTRAVENOUS | Status: DC | PRN
  Administered 2024-06-14 (×2): 50 mg via INTRAVENOUS
  Administered 2024-06-14: 100 mg via INTRAVENOUS
  Administered 2024-06-14: 50 mg via INTRAVENOUS

## 2024-06-14 MED ORDER — SODIUM CHLORIDE 0.9 % IV SOLN: INTRAVENOUS | Status: DC

## 2024-06-14 MED ORDER — LIDOCAINE HCL (CARDIAC) PF 100 MG/5ML IV SOSY
PREFILLED_SYRINGE | INTRAVENOUS | Status: DC | PRN
  Administered 2024-06-14: 50 mg via INTRAVENOUS

## 2024-06-14 MED ORDER — STERILE WATER FOR IRRIGATION IR SOLN
Status: DC | PRN
  Administered 2024-06-14: 1

## 2024-06-14 MED ORDER — LIDOCAINE HCL (PF) 2 % IJ SOLN
INTRAMUSCULAR | Status: AC
  Filled 2024-06-14: qty 5

## 2024-06-14 MED ORDER — LACTATED RINGERS IV SOLN: INTRAVENOUS | Status: DC

## 2024-06-14 MED ORDER — PROPOFOL 10 MG/ML IV BOLUS
INTRAVENOUS | Status: AC
  Filled 2024-06-14: qty 40

## 2024-06-14 SURGICAL SUPPLY — 5 items
GOWN CVR UNV OPN BCK APRN NK (MISCELLANEOUS) ×2 IMPLANT
KIT DEFENDO VALVE AND CONN (KITS) IMPLANT
KIT PROCEDURE OLYMPUS (MISCELLANEOUS) ×1 IMPLANT
MANIFOLD NEPTUNE II (INSTRUMENTS) ×1 IMPLANT
WATER STERILE IRR 250ML POUR (IV SOLUTION) ×1 IMPLANT

## 2024-06-14 NOTE — Transfer of Care (Signed)
 Immediate Anesthesia Transfer of Care Note  Patient: Dawn Summers  Procedure(s) Performed: COLONOSCOPY WITH BIOPSY (Rectum) POLYPECTOMY, INTESTINE (Rectum)  Patient Location: PACU  Anesthesia Type: General  Level of Consciousness: awake, alert  and patient cooperative  Airway and Oxygen Therapy: Patient Spontanous Breathing and Patient connected to supplemental oxygen  Post-op Assessment: Post-op Vital signs reviewed, Patient's Cardiovascular Status Stable, Respiratory Function Stable, Patent Airway and No signs of Nausea or vomiting  Post-op Vital Signs: Reviewed and stable  Complications: No notable events documented.

## 2024-06-14 NOTE — H&P (Signed)
 "  Dawn Schaffer, MD  [address], [address] [place], [place] [address] Phone: [phone] Fax : [phone]  Primary Care Physician:  Carin Gauze, NP Primary Gastroenterologist:  Dr. Schaffer  Pre-Procedure History & Physical: HPI:  Dawn Summers is a 58 y.o. female is here for a screening colonoscopy.  Prior colonoscopy? 2016, no polyps Fhx CRC? No Blood thinners? No  Past Medical History:  Diagnosis Date   Anxiety    Arthritis    Back pain    Cancer (HCC)    cervical   Carpal tunnel syndrome    Complication of anesthesia    slow to wake up   Family history of adverse reaction to anesthesia    mother was slow to wake up and post op nausea   GERD (gastroesophageal reflux disease)    Headache    History of kidney stones    Hyperlipidemia    Hypertension    Infected hand    Insomnia    Nephrolithiasis    Obesity (BMI 30-39.9)    Sleep apnea    does not use a cpap   Urethral diverticulum    Vitamin D  deficiency     Past Surgical History:  Procedure Laterality Date   Bladder Pack  2010   BREAST BIOPSY Left 2016   US  bx benign   CERVICAL SPINE SURGERY     C-3 and C-4 fusion   I & D EXTREMITY Right 06/22/2019   Procedure: IRRIGATION AND DEBRIDEMENT RIGHT HAND FOREIGN BODY;  Surgeon: Cleotilde Barrio, MD;  Location: [facility] ORS;  Service: Orthopedics;  Laterality: Right;   VAGINAL HYSTERECTOMY  1994   cervical cancer, ovaries intact    Prior to Admission medications  Medication Sig Start Date End Date Taking? Authorizing Provider  hydrochlorothiazide  (HYDRODIURIL ) 12.5 MG tablet Take 1 tablet (12.5 mg total) by mouth every morning. 05/05/23   Scoggins, [facility], NP  meloxicam  (MOBIC ) 15 MG tablet TAKE 1 TABLET(15 MG) BY MOUTH DAILY 05/27/24   Scoggins, Hospital [facility], NP  olmesartan  (BENICAR ) 20 MG tablet TAKE 1 AND 1/2 TABLETS BY MOUTH DAILY. 05/07/24   Scoggins, Amber, NP  pantoprazole  (PROTONIX ) 40 MG tablet Take 1 tablet (40 mg total) by mouth daily. 11/25/23   Scoggins,  Amber, NP  Plecanatide  (TRULANCE ) 3 MG TABS Take 1 tablet (3 mg total) by mouth daily. Patient taking differently: Take 1 tablet by mouth as needed. 08/26/23   Scoggins, Hospital Doctor, NP  rosuvastatin  (CRESTOR ) 20 MG tablet TAKE 1 TABLET(20 MG) BY MOUTH AT BEDTIME 05/03/24   Scoggins, [facility], NP    Allergies as of 03/30/2024 - Review Complete 03/30/2024  Allergen Reaction Noted   Latex  07/05/2015   Penicillins Hives 12/19/2014   Trazodone and nefazodone Nausea Only and Other (See Comments) 12/19/2014   Sulfa antibiotics Nausea And Vomiting 12/19/2014   Trazodone Nausea Only and Rash 12/19/2014    Family History  Problem Relation Age of Onset   Diabetes Mother    Lung disease Mother        chronic   Congestive Heart Failure Mother    Coronary artery disease Mother    Stroke Father    Cancer Father        prostate cancer   Hyperlipidemia Father    Hypertension Father    Colon polyps Father    COPD Father    Cancer Sister        lung cancer   Breast cancer Sister    Healthy Daughter    Healthy Daughter  Social History   Socioeconomic History   Marital status: Single    Spouse name: Not on file   Number of children: 2   Years of education: Not on file   Highest education level: Not on file  Occupational History    Employer: [employer]  Tobacco Use   Smoking status: Former    Current packs/day: 0.00    Average packs/day: 0.3 packs/day for 13.0 years (3.2 ttl pk-yrs)    Types: Cigarettes    Start date: 06/28/2006    Quit date: 06/22/2019    Years since quitting: 4.9   Smokeless tobacco: Never  Vaping Use   Vaping status: Never Used  Substance and Sexual Activity   Alcohol use: Yes    Comment: rarely   Drug use: No   Sexual activity: Not Currently    Birth control/protection: Surgical  Other Topics Concern   Not on file  Social History Narrative   Not on file   Social Drivers of Health   Tobacco Use: Medium Risk (06/10/2024)   Patient History    Smoking  Tobacco Use: Former    Smokeless Tobacco Use: Never    Passive Exposure: Not on Actuary Strain: Not on file  Food Insecurity: Not on file  Transportation Needs: Not on file  Physical Activity: Not on file  Stress: Not on file  Social Connections: Not on file  Intimate Partner Violence: Not on file  Depression (PHQ2-9): Not on file  Alcohol Screen: Not on file  Housing: Not on file  Utilities: Not on file  Health Literacy: Not on file    Review of Systems: See HPI, otherwise negative ROS  Physical Exam: Ht 5' 5 (1.651 m)   Wt 86.6 kg   BMI 31.77 kg/m  CONSTITUTIONAL: Well-appearing in no acute distress.  HEENT: Pupils equal, round, Extraocular movements intact. Conjunctivae clear NECK: Neck supple CARDIOVASCULAR: Regular rate, no LE edema  RESPIRATORY: No labored breathing  ABDOMEN: Abdomen soft, nontender, not distended, no guarding, no rigidity SKIN: No apparent skin rashes or lesions. NEUROLOGIC: Normal speech, no focal findings. Mental status alert and oriented x4. PSYCHIATRIC: Mood and affect normal.   Impression/Plan: Dawn Summers is now here to undergo a screening colonoscopy.  Risks, benefits, and alternatives regarding colonoscopy have been reviewed with the patient.  Questions have been answered.  All parties agreeable.  "

## 2024-06-14 NOTE — Op Note (Signed)
 [facility] Gastroenterology Patient Name: Dawn Summers Procedure Date: 06/14/2024 10:37 AM MRN: [id number] Account #: [id number] Date of Birth: 10-28-1965 Admit Type: Outpatient Age: 58 Room: [facility] OR ROOM 01 Gender: Female Note Status: Finalized Instrument Name: Arvis [id number] Procedure:             Colonoscopy Indications:           Screening for colorectal malignant neoplasm Providers:             Clotilda Schaffer, MD Referring MD:          Jeoffrey Pollen (Referring MD) Medicines:             Propofol  per Anesthesia Complications:         No immediate complications. Procedure:             Pre-Anesthesia Assessment:                        - Prior to the procedure, a History and Physical was                         performed, and patient medications and allergies were                         reviewed. The patient's tolerance of previous                         anesthesia was also reviewed. The risks and benefits                         of the procedure and the sedation options and risks                         were discussed with the patient. All questions were                         answered, and informed consent was obtained. Prior                         Anticoagulants: The patient has taken no anticoagulant                         or antiplatelet agents. ASA Grade Assessment: II - A                         patient with mild systemic disease. After reviewing                         the risks and benefits, the patient was deemed in                         satisfactory condition to undergo the procedure.                        After obtaining informed consent, the colonoscope was                         passed under direct vision. Throughout the procedure,  the patient's blood pressure, pulse, and oxygen                         saturations were monitored continuously. The                         Colonoscope was introduced through the  anus and                         advanced to the 10 cm into the ileum. The terminal                         ileum, ileocecal valve, appendiceal orifice, and                         rectum were photographed. Findings:      A 3 mm polyp was found in the ascending colon. The polyp was sessile.       The polyp was removed with a cold snare. Resection and retrieval were       complete. Estimated blood loss: none.      A few medium-mouthed diverticula were found in the sigmoid colon.      Internal hemorrhoids were found. The hemorrhoids were Grade I (internal       hemorrhoids that do not prolapse).      The terminal ileum appeared normal. Impression:            - One 3 mm polyp in the ascending colon, removed with                         a cold snare. Resected and retrieved.                        - Diverticulosis in the sigmoid colon.                        - Internal hemorrhoids.                        - The examined portion of the ileum was normal. Recommendation:        - Patient has a contact number available for                         emergencies. The signs and symptoms of potential                         delayed complications were discussed with the patient.                         Return to normal activities tomorrow. Written                         discharge instructions were provided to the patient.                        - High fiber diet.                        - Continue present medications.                        -  Await pathology results.                        - Repeat colonoscopy is recommended. The colonoscopy                         date will be determined after pathology results from                         today's exam become available for review, in either 5                         or 10 years.                        - The findings and recommendations were discussed with                         the designated responsible adult. Clotilda Schaffer, MD 06/14/2024 11:15:23  AM Number of Addenda: 0 Note Initiated On: 06/14/2024 10:37 AM Scope Withdrawal Time: 0 hours 6 minutes 47 seconds  Total Procedure Duration: 0 hours 9 minutes 25 seconds  Estimated Blood Loss:  Estimated blood loss: none.      [facility]

## 2024-06-14 NOTE — Anesthesia Postprocedure Evaluation (Signed)
"   Anesthesia Post Note  Patient: [name]  Procedure(s) Performed: COLONOSCOPY WITH BIOPSY (Rectum) POLYPECTOMY, INTESTINE (Rectum)  Patient location during evaluation: PACU Anesthesia Type: General Level of consciousness: awake and alert Pain management: pain level controlled Vital Signs Assessment: post-procedure vital signs reviewed and stable Respiratory status: spontaneous breathing, nonlabored ventilation, respiratory function stable and patient connected to nasal cannula oxygen Cardiovascular status: blood pressure returned to baseline and stable Postop Assessment: no apparent nausea or vomiting Anesthetic complications: no   No notable events documented.   Last Vitals:  Vitals:   06/14/24 1118 06/14/24 1125  BP:  107/68  Pulse: 78 66  Resp: (!) 22 14  Temp:  (!) 36.2 C  SpO2: 94% 94%    Last Pain:  Vitals:   06/14/24 1125  TempSrc:   PainSc: 0-No pain                 Nashia Remus C Mali Eppard      "

## 2024-06-15 LAB — SURGICAL PATHOLOGY

## 2024-06-21 ENCOUNTER — Ambulatory Visit: Payer: Self-pay | Admitting: Gastroenterology

## 2024-06-28 ENCOUNTER — Telehealth: Payer: Self-pay | Admitting: Podiatry

## 2024-06-28 NOTE — Telephone Encounter (Signed)
 Patient is scheduled for surgery on 07/26/24. Patient is not taking Blood Thinners or GPL1 medications. Pharmacy is correct in chart.

## 2024-06-28 NOTE — Telephone Encounter (Signed)
 Called and left message for patient to contact office to schedule surgery. MyChart message sent as well.

## 2024-07-21 NOTE — Telephone Encounter (Signed)
 Patient called and rescheduled surgery to 09/13/2024.

## 2024-07-27 ENCOUNTER — Ambulatory Visit: Admitting: Cardiology

## 2024-07-27 ENCOUNTER — Encounter: Payer: Self-pay | Admitting: Cardiology

## 2024-07-27 ENCOUNTER — Other Ambulatory Visit: Payer: Self-pay

## 2024-07-27 VITALS — BP 132/84 | HR 64 | Ht 65.0 in | Wt 193.6 lb

## 2024-07-27 DIAGNOSIS — R5383 Other fatigue: Secondary | ICD-10-CM | POA: Diagnosis not present

## 2024-07-27 DIAGNOSIS — I10 Essential (primary) hypertension: Secondary | ICD-10-CM

## 2024-07-27 DIAGNOSIS — E782 Mixed hyperlipidemia: Secondary | ICD-10-CM | POA: Diagnosis not present

## 2024-07-27 DIAGNOSIS — Z1329 Encounter for screening for other suspected endocrine disorder: Secondary | ICD-10-CM | POA: Diagnosis not present

## 2024-07-27 DIAGNOSIS — R7303 Prediabetes: Secondary | ICD-10-CM | POA: Diagnosis not present

## 2024-07-27 MED ORDER — HYDROCHLOROTHIAZIDE 12.5 MG PO TABS: 12.5000 mg | ORAL_TABLET | Freq: Every morning | ORAL | 0 refills | Status: AC

## 2024-07-27 MED ORDER — MELOXICAM 15 MG PO TABS: 15.0000 mg | ORAL_TABLET | Freq: Every day | ORAL | 0 refills | Status: AC

## 2024-07-28 ENCOUNTER — Ambulatory Visit: Payer: Self-pay | Admitting: Cardiology

## 2024-07-28 LAB — CMP14+EGFR
ALT: 20 [IU]/L (ref 0–32)
AST: 19 [IU]/L (ref 0–40)
Albumin: 4.2 g/dL (ref 3.8–4.9)
Alkaline Phosphatase: 86 [IU]/L (ref 49–135)
BUN/Creatinine Ratio: 16 (ref 12–28)
BUN: 15 mg/dL (ref 8–27)
Bilirubin Total: 0.4 mg/dL (ref 0.0–1.2)
CO2: 25 mmol/L (ref 20–29)
Calcium: 9.5 mg/dL (ref 8.7–10.3)
Chloride: 100 mmol/L (ref 96–106)
Creatinine, Ser: 0.92 mg/dL (ref 0.57–1.00)
Globulin, Total: 2.5 g/dL (ref 1.5–4.5)
Glucose: 101 mg/dL — ABNORMAL HIGH (ref 70–99)
Potassium: 4.4 mmol/L (ref 3.5–5.2)
Sodium: 138 mmol/L (ref 134–144)
Total Protein: 6.7 g/dL (ref 6.0–8.5)
eGFR: 71 mL/min/{1.73_m2}

## 2024-07-28 LAB — CBC WITH DIFFERENTIAL/PLATELET
Basophils Absolute: 0.1 10*3/uL (ref 0.0–0.2)
Basos: 1 %
EOS (ABSOLUTE): 0.2 10*3/uL (ref 0.0–0.4)
Eos: 2 %
Hematocrit: 46.5 % (ref 34.0–46.6)
Hemoglobin: 15.5 g/dL (ref 11.1–15.9)
Immature Grans (Abs): 0 10*3/uL (ref 0.0–0.1)
Immature Granulocytes: 0 %
Lymphocytes Absolute: 2.1 10*3/uL (ref 0.7–3.1)
Lymphs: 25 %
MCH: 28.8 pg (ref 26.6–33.0)
MCHC: 33.3 g/dL (ref 31.5–35.7)
MCV: 86 fL (ref 79–97)
Monocytes Absolute: 0.5 10*3/uL (ref 0.1–0.9)
Monocytes: 6 %
Neutrophils Absolute: 5.5 10*3/uL (ref 1.4–7.0)
Neutrophils: 66 %
Platelets: 285 10*3/uL (ref 150–450)
RBC: 5.39 x10E6/uL — ABNORMAL HIGH (ref 3.77–5.28)
RDW: 14 % (ref 11.7–15.4)
WBC: 8.3 10*3/uL (ref 3.4–10.8)

## 2024-07-28 LAB — LIPID PANEL
Chol/HDL Ratio: 2.1 ratio (ref 0.0–4.4)
Cholesterol, Total: 158 mg/dL (ref 100–199)
HDL: 77 mg/dL
LDL Chol Calc (NIH): 69 mg/dL (ref 0–99)
Triglycerides: 61 mg/dL (ref 0–149)
VLDL Cholesterol Cal: 12 mg/dL (ref 5–40)

## 2024-07-28 LAB — IRON,TIBC AND FERRITIN PANEL
Ferritin: 20 ng/mL (ref 15–150)
Iron Saturation: 10 % — ABNORMAL LOW (ref 15–55)
Iron: 44 ug/dL (ref 27–159)
Total Iron Binding Capacity: 422 ug/dL (ref 250–450)
UIBC: 378 ug/dL (ref 131–425)

## 2024-07-28 LAB — TSH: TSH: 0.907 u[IU]/mL (ref 0.450–4.500)

## 2024-07-28 LAB — HEMOGLOBIN A1C
Est. average glucose Bld gHb Est-mCnc: 126 mg/dL
Hgb A1c MFr Bld: 6 % — ABNORMAL HIGH (ref 4.8–5.6)

## 2024-07-28 NOTE — Progress Notes (Signed)
 Patient notified

## 2024-07-30 ENCOUNTER — Telehealth: Payer: Self-pay | Admitting: Podiatry

## 2024-07-30 NOTE — Telephone Encounter (Signed)
 DOS- 09/13/2024  REPAIR, PRIMARY, OPEN OR PERCUTANEOUS RUPTURED ACHILLES TENDON LT- 27650  [employer] EFFECTIVE DATE- 03/24/2024  DEDUCTIBLE- $5000 REMAINING- $3277.11 OOP- $9200 REMAINING- $2836.37 COINSURANCE- 30%  PER [employer] PORTAL, PRIOR AUTH IS NOT REQUIRED FOR CPT CODE 72349.

## 2024-08-03 ENCOUNTER — Encounter: Admitting: Podiatry

## 2024-08-04 ENCOUNTER — Encounter

## 2024-08-17 ENCOUNTER — Encounter: Admitting: Podiatry

## 2024-08-18 ENCOUNTER — Encounter

## 2024-09-21 ENCOUNTER — Encounter: Admitting: Podiatry

## 2024-10-05 ENCOUNTER — Encounter: Admitting: Podiatry

## 2024-11-25 ENCOUNTER — Ambulatory Visit: Admitting: Internal Medicine
